# Patient Record
Sex: Male | Born: 1990 | Race: Asian | Hispanic: No | Marital: Single | Smoking: Former smoker
Health system: Southern US, Community
[De-identification: ages and names within clinical notes are randomized; demographics above are authoritative.]

## PROBLEM LIST (undated history)

## (undated) DIAGNOSIS — T7840XA Allergy, unspecified, initial encounter: Secondary | ICD-10-CM

## (undated) DIAGNOSIS — H9190 Unspecified hearing loss, unspecified ear: Secondary | ICD-10-CM

## (undated) DIAGNOSIS — K219 Gastro-esophageal reflux disease without esophagitis: Secondary | ICD-10-CM

## (undated) DIAGNOSIS — R21 Rash and other nonspecific skin eruption: Secondary | ICD-10-CM

## (undated) DIAGNOSIS — Z9621 Cochlear implant status: Secondary | ICD-10-CM

## (undated) HISTORY — DX: Cochlear implant status: Z96.21

## (undated) HISTORY — PX: ANTERIOR CRUCIATE LIGAMENT REPAIR: SHX115

## (undated) HISTORY — DX: Unspecified hearing loss, unspecified ear: H91.90

## (undated) HISTORY — DX: Rash and other nonspecific skin eruption: R21

## (undated) HISTORY — DX: Allergy, unspecified, initial encounter: T78.40XA

## (undated) HISTORY — PX: COCHLEAR IMPLANT: SUR684

## (undated) HISTORY — DX: Gastro-esophageal reflux disease without esophagitis: K21.9

---
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
## 2008-01-14 ENCOUNTER — Ambulatory Visit: Payer: Self-pay | Admitting: Family Medicine

## 2008-01-15 ENCOUNTER — Encounter: Admission: RE | Admit: 2008-01-15 | Discharge: 2008-01-15 | Payer: Self-pay | Admitting: Family Medicine

## 2009-06-22 ENCOUNTER — Ambulatory Visit: Payer: Self-pay | Admitting: Family Medicine

## 2010-02-22 ENCOUNTER — Ambulatory Visit: Payer: Self-pay | Admitting: Family Medicine

## 2010-05-17 ENCOUNTER — Institutional Professional Consult (permissible substitution): Payer: Medicaid Other | Admitting: Physician Assistant

## 2010-06-18 ENCOUNTER — Other Ambulatory Visit: Payer: Self-pay | Admitting: Family Medicine

## 2010-06-18 ENCOUNTER — Ambulatory Visit
Admission: RE | Admit: 2010-06-18 | Discharge: 2010-06-18 | Disposition: A | Discharge status: Home / Self Care | Payer: Medicaid Other | Source: Ambulatory Visit | Attending: Family Medicine | Admitting: Family Medicine

## 2010-06-18 ENCOUNTER — Ambulatory Visit (INDEPENDENT_AMBULATORY_CARE_PROVIDER_SITE_OTHER): Payer: Medicaid Other | Admitting: Family Medicine

## 2010-06-18 DIAGNOSIS — R52 Pain, unspecified: Secondary | ICD-10-CM

## 2010-06-18 DIAGNOSIS — S6000XA Contusion of unspecified finger without damage to nail, initial encounter: Secondary | ICD-10-CM

## 2010-06-27 ENCOUNTER — Ambulatory Visit (INDEPENDENT_AMBULATORY_CARE_PROVIDER_SITE_OTHER): Payer: Medicaid Other | Admitting: Family Medicine

## 2010-06-27 DIAGNOSIS — J0100 Acute maxillary sinusitis, unspecified: Secondary | ICD-10-CM

## 2010-08-09 ENCOUNTER — Ambulatory Visit (INDEPENDENT_AMBULATORY_CARE_PROVIDER_SITE_OTHER): Payer: Medicaid Other | Admitting: Family Medicine

## 2010-08-09 DIAGNOSIS — R599 Enlarged lymph nodes, unspecified: Secondary | ICD-10-CM

## 2010-08-15 ENCOUNTER — Encounter: Payer: Self-pay | Admitting: Family Medicine

## 2010-08-29 ENCOUNTER — Telehealth: Payer: Self-pay | Admitting: *Deleted

## 2010-08-29 ENCOUNTER — Ambulatory Visit: Payer: Medicaid Other | Admitting: Family Medicine

## 2010-08-29 NOTE — Telephone Encounter (Signed)
Left message on patients cell number to please r/s appt that was missed today ASAP. Also spoke with patient's sister(emergency contact) and she stated that she will give her brother the message to please reschedule, I did stress the importance of the recheck. I will also follow up again tomorrow.

## 2010-08-30 ENCOUNTER — Ambulatory Visit (INDEPENDENT_AMBULATORY_CARE_PROVIDER_SITE_OTHER): Payer: Medicaid Other | Admitting: Family Medicine

## 2010-08-30 ENCOUNTER — Encounter: Payer: Self-pay | Admitting: Family Medicine

## 2010-08-30 VITALS — BP 100/68 | HR 84 | Ht 64.0 in | Wt 169.0 lb

## 2010-08-30 DIAGNOSIS — R599 Enlarged lymph nodes, unspecified: Secondary | ICD-10-CM

## 2010-08-30 DIAGNOSIS — J309 Allergic rhinitis, unspecified: Secondary | ICD-10-CM

## 2010-08-30 DIAGNOSIS — R591 Generalized enlarged lymph nodes: Secondary | ICD-10-CM

## 2010-08-30 NOTE — Patient Instructions (Signed)
We are referring you to an ENT doctor for evaluation of the lymph node, which may include biopsy. You seem to have congestion and allergies, and might feel better by taking Claritin (OTC Loratidine) or Zyrtec on a regular basis

## 2010-08-30 NOTE — Progress Notes (Signed)
Addended byJoselyn Arrow on: 08/30/2010 05:08 PM   Modules accepted: Orders

## 2010-08-30 NOTE — Progress Notes (Signed)
  Subjective:    Patient ID: Antonio Li, male    DOB: 03/29/1991, 20 y.o.   MRN: [id number]  HPI Patient is here to follow up on enlarged lymph node.  He was last seen 4/26 and was rx'd Augmentin x 10 days.  He still has two pills, admits that he has only mainly been taking it once a day, and only sometimes taking it twice--thought he needed to take it with food, and often doesn't eat breakfast.  The enlarged lymph node persist.  Acne is about the same.  Denies fevers.  Trying to lose weight, trying to reach a goal of 165.  Active in playing soccer, and eating healthy foods such as fruits, and drinking a lot of water.  He has lost 10 pounds in the last 2 months (intentionally)   Review of Systems No fevers.  Some PND and sore throat.  Denies sinus pain.  Some cough, no SOB.  Denies sneezing, allergies.  No skin rash, nausea, vomiting or diarrhea    Objective:   Physical Exam BP 100/68  Pulse 84  Ht 5\' 4"  (1.626 m)  Wt 169 lb (76.658 kg)  BMI 29.01 kg/m2 Well developed, pleasant male, mildly congested, in no distress HEENT:  TM's normal; Nasal mucosa moderately edematous, clear mucus.  Sinuses nontender.  OP--tonsils mildly enlarged, but no erythema or exudates.  Mild cobblestoning posteriorly Neck:  3x2 cm firm lymph node L submandular region.  No overlying erythema, warmth, or fluctuance     Assessment & Plan:   1. Lymphadenopathy  Ambulatory referral to ENT   Unchanged after course of Augmentin (although noncompliant with taking med); firm node.  Refer to ENT for biopsy/eval  2. Allergic rhinitis, cause unspecified     Recommend re-start Claritin or other OTC antihistamine

## 2010-09-03 ENCOUNTER — Ambulatory Visit (INDEPENDENT_AMBULATORY_CARE_PROVIDER_SITE_OTHER): Payer: Medicaid Other | Admitting: Family Medicine

## 2010-09-03 ENCOUNTER — Encounter: Payer: Self-pay | Admitting: Family Medicine

## 2010-09-03 VITALS — BP 92/58 | HR 105 | Temp 98.6°F | Wt 173.0 lb

## 2010-09-03 DIAGNOSIS — J329 Chronic sinusitis, unspecified: Secondary | ICD-10-CM

## 2010-09-03 MED ORDER — AZITHROMYCIN 250 MG PO TABS: ORAL_TABLET | ORAL | Status: AC

## 2010-09-03 NOTE — Progress Notes (Signed)
  Subjective:    Patient ID: Antonio Li, male    DOB: 1990/09/01, 20 y.o.   MRN: [id number]  HPI Patient presents with complaint of runny nose, post-nasal drip, sore throat.  Denies sinus pain.  Feels hot sometimes, broke out into a sweat this morning.  Doesn't have thermometer.  Occasional cough.  Getting yellow mucus from the nose and phlegm.  Has been sick for a week, getting worse.  Took night-time Tylenol last night.  Had some vomiting this past weekend.  Denies diarrhea.  Denies sick contacts.  Review of Systems No diarrhea, skin rash, shortness of breath. +URI symptoms, cough, vomiting, + fatigue    Objective:   Physical Exam BP 92/58  Pulse 105  Temp(Src) 98.6 F (37 C) (Oral)  Wt 173 lb (78.472 kg)  SpO2 98% Well developed, pleasant male, in no distress HEENT: PERRL, EOMI; TM's not well visualized due to cerumen.  OP: Tonsils mildly enlarged, L>R, slight erythema.  Cobblestoning posteriorly   Sinuses nontender. Neck: unchanged--still has firm LN L submandibular area, no other LAD noted Hear: slightly tachycardic at about 96-100, no murmurs Lungs: CTA bilaterally, no wheezes Skin: no rash   Assessment & Plan:   1. Sinusitis  azithromycin (ZITHROMAX Z-PAK) 250 MG tablet  Supportive measures reviewed, including antihistamines, decongestants prn, and Mucinex.  Pt was given phone number for [facility] to schedule appt, as they had trouble reaching him to set up appt

## 2010-09-03 NOTE — Patient Instructions (Signed)
I recommend that you start taking Loratidine 10mg  (ie. Claritin), along with Sudafed, if needed, to help dry up your mucus and help with sinus pain.  I also recommend that you get Mucinex, as this will loosen your phlegm and help with your cough

## 2010-09-12 ENCOUNTER — Telehealth: Payer: Self-pay | Admitting: *Deleted

## 2010-09-12 NOTE — Telephone Encounter (Signed)
Left message with sister ZO:XWRUE auth needed for levofloxacin written by dr Onalee Hua shoemaker. Dr Lynelle Doctor tried to get this done but was unable, she called [facility] and they are aware of this request and are working on it. Just wanted to inform pat.

## 2010-09-14 ENCOUNTER — Other Ambulatory Visit: Payer: Self-pay | Admitting: Otolaryngology

## 2010-09-14 DIAGNOSIS — K118 Other diseases of salivary glands: Secondary | ICD-10-CM

## 2010-09-21 ENCOUNTER — Ambulatory Visit
Admission: RE | Admit: 2010-09-21 | Discharge: 2010-09-21 | Disposition: A | Discharge status: Home / Self Care | Payer: Medicaid Other | Source: Ambulatory Visit | Attending: Otolaryngology | Admitting: Otolaryngology

## 2010-09-21 DIAGNOSIS — K118 Other diseases of salivary glands: Secondary | ICD-10-CM

## 2010-09-21 MED ORDER — IOHEXOL 300 MG/ML  SOLN
75.0000 mL | Freq: Once | INTRAMUSCULAR | Status: AC | PRN
  Administered 2010-09-21: 75 mL via INTRAVENOUS

## 2010-11-12 ENCOUNTER — Encounter: Payer: Self-pay | Admitting: Family Medicine

## 2010-11-12 ENCOUNTER — Ambulatory Visit (INDEPENDENT_AMBULATORY_CARE_PROVIDER_SITE_OTHER): Payer: Medicaid Other | Admitting: Family Medicine

## 2010-11-12 VITALS — BP 92/60 | HR 84 | Ht 64.0 in | Wt 179.0 lb

## 2010-11-12 DIAGNOSIS — M25562 Pain in left knee: Secondary | ICD-10-CM

## 2010-11-12 DIAGNOSIS — M25569 Pain in unspecified knee: Secondary | ICD-10-CM

## 2010-11-12 MED ORDER — IBUPROFEN 800 MG PO TABS: 800.0000 mg | ORAL_TABLET | Freq: Three times a day (TID) | ORAL | Status: AC | PRN

## 2010-11-12 MED ORDER — HYDROCODONE-ACETAMINOPHEN 5-500 MG PO TABS: ORAL_TABLET | ORAL | Status: DC

## 2010-11-12 NOTE — Progress Notes (Signed)
Yesterday played soccer all day. During semi-finals, he approached the soccer ball same time as opponent, both kicking the ball with their right feet at the same time.  When this occurred, he felt a shock of pain at his left knee.  Fell down, couldn't get up.  R calf cramped, L knee swelled.  Unable to walk off field.  Used [employer] and ice, which helped with the swelling.  Didn't take any pain medications. Was unable to continue to play.  Today, continues to complain of pain.  Hurts to put weight on it, and to straighten and bend the left leg.  Denies radiation of pain.  Points to entire knee as painful (no one specific area)  Past Medical History  Diagnosis Date  . Deafness    Past Surgical History  Procedure Date  . Cochlear implant 2004   No current outpatient prescriptions on file prior to visit.   No Known Allergies  ROS:  Denies fevers.  Continues to have enlarged lymph node L jaw--he apparently missed an appointment for imaging procedure as ordered by ENT after an additional course of ABX.  PHYSICAL EXAM: BP 92/60  Pulse 84  Ht 5\' 4"  (1.626 m)  Wt 179 lb (81.194 kg)  BMI 30.73 kg/m2 Pleasant male, in moderate distress L knee--significant effusion, mild warmth.  Significantly limited range of motion--unable to completely extend, and very limited flexion.  This severely limited exam (unable to fully assess cruciate ligaments).  Mild tenderness over both medial and lateral collateral ligaments, but no pain with valgus and varus stressors 2+ distal pulse.  Intact skin  ASSESSMENT/PLAN 1. Left knee pain  ibuprofen (ADVIL,MOTRIN) 800 MG tablet, HYDROcodone-acetaminophen (VICODIN) 5-500 MG per tablet   Suspect internal derangement given effusion and acute onset of pain/swelling. Treat with knee immobilizer (Rx given to get at medical supply store), rest, ice elevation.  NSAID's, vicodin prn Follow-up with Dr. Susann Givens next week.  Consider arthrocentesis to help with pain control (by  draining effusion) if not responding to NSAIDs

## 2010-11-12 NOTE — Patient Instructions (Addendum)
Take the ibuprofen every day, three times daily; take with food.  Stop if it causes stomach pain, and call here. Use the hydrocodone only if needed for severe pain.   Use the knee immobilizer. Follow up next week for re-evaluation with Dr. Susann Givens  Rest, ice, elevate

## 2010-11-22 ENCOUNTER — Encounter: Payer: Self-pay | Admitting: Family Medicine

## 2010-11-22 ENCOUNTER — Ambulatory Visit (INDEPENDENT_AMBULATORY_CARE_PROVIDER_SITE_OTHER): Payer: Medicaid Other | Admitting: Family Medicine

## 2010-11-22 ENCOUNTER — Other Ambulatory Visit: Payer: Self-pay | Admitting: *Deleted

## 2010-11-22 VITALS — BP 112/70 | HR 68 | Wt 173.0 lb

## 2010-11-22 DIAGNOSIS — M25562 Pain in left knee: Secondary | ICD-10-CM

## 2010-11-22 DIAGNOSIS — M25569 Pain in unspecified knee: Secondary | ICD-10-CM

## 2010-11-22 NOTE — Progress Notes (Signed)
  Subjective:    Patient ID: Antonio Li, male    DOB: [birth year], 20 y.o.   MRN: [id number]  HPI He is here for recheck. The swelling has diminished. He still has difficulty with full range of motion of the knee.   Review of Systems     Objective:   Physical Exam Left knee exam does show a moderate effusion. Anterior drawer is positive. Murray's testing causes discomfort laterally. Medial lateral collateral ligaments are normal       Assessment & Plan:  Probable anterior cruciate ligament tear MRI.

## 2010-11-28 ENCOUNTER — Telehealth: Payer: Self-pay | Admitting: Family Medicine

## 2010-11-28 NOTE — Telephone Encounter (Signed)
He needs to get MRI--please see if authorization is needed. He needs MRI due to suspected acute ACL tear

## 2010-11-28 NOTE — Telephone Encounter (Signed)
Called [facility] @ [phone] option 3 with case #[id number] , they state that an MRI will not be covered until a 6-8 week trial of adequate conservative treatment has been completed, example: pain meds, steroid treatment or physical therapy. PT referral was sent to [facility] for patient. We can re-try to get MRI approved after completion of therapy for 6-8 weeks.

## 2010-11-29 ENCOUNTER — Other Ambulatory Visit: Payer: Medicaid Other

## 2011-01-29 ENCOUNTER — Telehealth: Payer: Self-pay | Admitting: Family Medicine

## 2011-01-30 NOTE — Telephone Encounter (Signed)
Dr. Lynelle Doctor I do not see where you recently spoke with patient, please advise.

## 2011-01-30 NOTE — Telephone Encounter (Signed)
Suzette Battiest spoke with them last week.  It has to do with the fact that he was referred for MRI of knee a LONG time ago.  It was denied, stating he needed to have PT first.  Apparently there was trouble reaching them and arranging for PT, and I'm not sure if it was even arranged yet (you may need to check with Suzette Battiest if no documentation about this in his chart--check paper chart)

## 2011-02-04 ENCOUNTER — Telehealth: Payer: Self-pay | Admitting: *Deleted

## 2011-02-04 NOTE — Telephone Encounter (Signed)
Called over to [facility] and spoke to [place]. She stated that they tried to call patient w/o any success. They will try again today. I also called his sisters number Dewayne Hatch) and left message for her with their phone number for her to call them and try to get [facility] scheduled for PT.

## 2011-02-05 ENCOUNTER — Ambulatory Visit: Payer: Medicaid Other | Attending: Orthopedic Surgery

## 2011-02-05 DIAGNOSIS — M25569 Pain in unspecified knee: Secondary | ICD-10-CM | POA: Insufficient documentation

## 2011-02-05 DIAGNOSIS — R262 Difficulty in walking, not elsewhere classified: Secondary | ICD-10-CM | POA: Insufficient documentation

## 2011-02-05 DIAGNOSIS — IMO0001 Reserved for inherently not codable concepts without codable children: Secondary | ICD-10-CM | POA: Insufficient documentation

## 2011-02-05 DIAGNOSIS — R5381 Other malaise: Secondary | ICD-10-CM | POA: Insufficient documentation

## 2011-02-18 ENCOUNTER — Ambulatory Visit: Payer: Medicaid Other | Admitting: Physical Therapy

## 2011-02-21 ENCOUNTER — Ambulatory Visit: Payer: Medicaid Other | Attending: Orthopedic Surgery

## 2011-02-21 DIAGNOSIS — M25569 Pain in unspecified knee: Secondary | ICD-10-CM | POA: Insufficient documentation

## 2011-02-21 DIAGNOSIS — R262 Difficulty in walking, not elsewhere classified: Secondary | ICD-10-CM | POA: Insufficient documentation

## 2011-02-21 DIAGNOSIS — IMO0001 Reserved for inherently not codable concepts without codable children: Secondary | ICD-10-CM | POA: Insufficient documentation

## 2011-02-21 DIAGNOSIS — R5381 Other malaise: Secondary | ICD-10-CM | POA: Insufficient documentation

## 2011-02-22 ENCOUNTER — Ambulatory Visit (INDEPENDENT_AMBULATORY_CARE_PROVIDER_SITE_OTHER): Payer: Medicaid Other | Admitting: Family Medicine

## 2011-02-22 ENCOUNTER — Encounter: Payer: Self-pay | Admitting: Family Medicine

## 2011-02-22 ENCOUNTER — Ambulatory Visit
Admission: RE | Admit: 2011-02-22 | Discharge: 2011-02-22 | Disposition: A | Discharge status: Home / Self Care | Payer: Medicaid Other | Source: Ambulatory Visit | Attending: Family Medicine | Admitting: Family Medicine

## 2011-02-22 VITALS — BP 118/78 | HR 95 | Wt 176.0 lb

## 2011-02-22 DIAGNOSIS — R1031 Right lower quadrant pain: Secondary | ICD-10-CM

## 2011-02-22 DIAGNOSIS — N200 Calculus of kidney: Secondary | ICD-10-CM

## 2011-02-22 LAB — CBC WITH DIFFERENTIAL/PLATELET
Lymphs Abs: 2.4 10*3/uL (ref 0.7–4.0)
MCH: 27.1 pg (ref 26.0–34.0)
MCHC: 32.3 g/dL (ref 30.0–36.0)
MCV: 84.1 fL (ref 78.0–100.0)
Monocytes Relative: 4 % (ref 3–12)
Neutro Abs: 12.2 10*3/uL — ABNORMAL HIGH (ref 1.7–7.7)
RDW: 12.4 % (ref 11.5–15.5)

## 2011-02-22 LAB — BASIC METABOLIC PANEL
Calcium: 9.3 mg/dL (ref 8.4–10.5)
Creat: 1 mg/dL (ref 0.50–1.35)
Glucose, Bld: 93 mg/dL (ref 70–99)
Sodium: 140 mEq/L (ref 135–145)

## 2011-02-22 MED ORDER — HYDROCODONE-ACETAMINOPHEN 7.5-750 MG PO TABS: 1.0000 | ORAL_TABLET | Freq: Four times a day (QID) | ORAL | Status: DC | PRN

## 2011-02-22 MED ORDER — IOHEXOL 300 MG/ML  SOLN
100.0000 mL | Freq: Once | INTRAMUSCULAR | Status: AC | PRN
  Administered 2011-02-22: 100 mL via INTRAVENOUS

## 2011-02-22 NOTE — Progress Notes (Signed)
  Subjective:    Patient ID: Antonio Li, male    DOB: Jul 01, 1990, 20 y.o.   MRN: [id number]  HPI He has a 3 day history of right lower cautery and pain with nausea but no vomiting, fever, diarrhea.   Review of Systems     Objective:   Physical Exam Alert and uncomfortable appearing. Abdominal exam shows decreased bowel sounds with no masses. He does have right lower "or tenderness with rebound and referred pain. Genital exam normal.       Assessment & Plan:  Right lower quadrant pain. CT scan ordered. The CT scan showed a kidney stone. He will drink plenty of fluids, take pain medication and I will see him on Monday.

## 2011-02-25 ENCOUNTER — Telehealth: Payer: Self-pay | Admitting: Family Medicine

## 2011-02-25 ENCOUNTER — Ambulatory Visit: Payer: Medicaid Other | Admitting: Family Medicine

## 2011-02-26 ENCOUNTER — Telehealth: Payer: Self-pay | Admitting: Family Medicine

## 2011-02-26 NOTE — Telephone Encounter (Signed)
LM

## 2011-02-26 NOTE — Telephone Encounter (Signed)
DONE

## 2011-02-28 ENCOUNTER — Ambulatory Visit: Payer: Medicaid Other

## 2011-03-05 ENCOUNTER — Ambulatory Visit: Payer: Medicaid Other

## 2011-03-05 ENCOUNTER — Ambulatory Visit (INDEPENDENT_AMBULATORY_CARE_PROVIDER_SITE_OTHER): Payer: Medicaid Other | Admitting: Family Medicine

## 2011-03-05 ENCOUNTER — Encounter: Payer: Self-pay | Admitting: Family Medicine

## 2011-03-05 VITALS — BP 110/64 | HR 92 | Wt 176.0 lb

## 2011-03-05 DIAGNOSIS — M25562 Pain in left knee: Secondary | ICD-10-CM

## 2011-03-05 DIAGNOSIS — M25569 Pain in unspecified knee: Secondary | ICD-10-CM

## 2011-03-05 MED ORDER — AMOXICILLIN 875 MG PO TABS: 875.0000 mg | ORAL_TABLET | Freq: Two times a day (BID) | ORAL | Status: AC

## 2011-03-05 NOTE — Patient Instructions (Addendum)
I will call you with the results of the CT scan

## 2011-03-05 NOTE — Progress Notes (Signed)
  Subjective:    Patient ID: Antonio Li, male    DOB: [birth year], 22 y.o.   MRN: [id number]  HPI He is here for a recheck. He has been through knee rehabilitation. The evaluation from physical therapy indicates that he is really made no progress and in fact he is now noting popping sensation especially goes up and down steps. He still does have some instability. He also complains of a two-week history of chest congestion and coughing. No fever, chills or sore throat.   Review of Systems     Objective:   Physical Exam Exam of the left knee does show minimal effusion. Anterior drawer is positive. McMurray's testing did not elicit a pop however there was discomfort. Medial and lateral collateral ligaments were intact. alert and in no distress. Tympanic membranes and canals are normal. Throat is clear. Tonsils are normal. Neck is supple without adenopathy or thyromegaly. Cardiac exam shows a regular sinus rhythm without murmurs or gallops. Lungs are clear to auscultation.       Assessment & Plan:  Internal derangement of left knee. Bronchitis I will call in Amoxil. CT will be ordered to do to him having a cochlear implant.

## 2011-03-06 NOTE — Progress Notes (Signed)
Addended by: Barbette Or A on: 03/06/2011 03:13 PM   Modules accepted: Orders

## 2011-03-08 ENCOUNTER — Ambulatory Visit
Admission: RE | Admit: 2011-03-08 | Discharge: 2011-03-08 | Disposition: A | Discharge status: Home / Self Care | Payer: Medicaid Other | Source: Ambulatory Visit | Attending: Family Medicine | Admitting: Family Medicine

## 2011-03-08 ENCOUNTER — Other Ambulatory Visit: Payer: Medicaid Other

## 2011-03-08 ENCOUNTER — Other Ambulatory Visit: Payer: Self-pay | Admitting: Family Medicine

## 2011-03-08 DIAGNOSIS — M25569 Pain in unspecified knee: Secondary | ICD-10-CM

## 2011-03-08 DIAGNOSIS — M25562 Pain in left knee: Secondary | ICD-10-CM

## 2011-04-22 ENCOUNTER — Telehealth: Payer: Self-pay | Admitting: Family Medicine

## 2011-04-22 NOTE — Telephone Encounter (Signed)
He needs to get this from his surgeon

## 2011-04-23 NOTE — Telephone Encounter (Signed)
Left message pt needs to get paper filled out by surgen

## 2011-04-24 ENCOUNTER — Ambulatory Visit: Payer: Medicaid Other | Attending: Orthopedic Surgery | Admitting: Rehabilitative and Restorative Service Providers"

## 2011-04-24 DIAGNOSIS — IMO0001 Reserved for inherently not codable concepts without codable children: Secondary | ICD-10-CM | POA: Insufficient documentation

## 2011-04-24 DIAGNOSIS — R5381 Other malaise: Secondary | ICD-10-CM | POA: Insufficient documentation

## 2011-04-24 DIAGNOSIS — M25569 Pain in unspecified knee: Secondary | ICD-10-CM | POA: Insufficient documentation

## 2011-04-24 DIAGNOSIS — R262 Difficulty in walking, not elsewhere classified: Secondary | ICD-10-CM | POA: Insufficient documentation

## 2011-04-30 ENCOUNTER — Ambulatory Visit: Payer: Medicaid Other

## 2011-05-07 ENCOUNTER — Ambulatory Visit: Payer: Medicaid Other | Admitting: Rehabilitation

## 2011-05-08 ENCOUNTER — Ambulatory Visit: Payer: Medicaid Other | Admitting: Physical Therapy

## 2011-05-09 ENCOUNTER — Encounter: Payer: Medicaid Other | Admitting: Rehabilitative and Restorative Service Providers"

## 2011-05-14 ENCOUNTER — Encounter: Payer: Medicaid Other | Admitting: Rehabilitative and Restorative Service Providers"

## 2011-05-16 ENCOUNTER — Encounter: Payer: Medicaid Other | Admitting: Rehabilitative and Restorative Service Providers"

## 2011-05-21 ENCOUNTER — Ambulatory Visit: Payer: Medicaid Other | Attending: Orthopedic Surgery | Admitting: Rehabilitative and Restorative Service Providers"

## 2011-05-21 DIAGNOSIS — IMO0001 Reserved for inherently not codable concepts without codable children: Secondary | ICD-10-CM | POA: Insufficient documentation

## 2011-05-21 DIAGNOSIS — M25569 Pain in unspecified knee: Secondary | ICD-10-CM | POA: Insufficient documentation

## 2011-05-28 ENCOUNTER — Ambulatory Visit: Payer: Medicaid Other | Admitting: Rehabilitative and Restorative Service Providers"

## 2011-11-07 ENCOUNTER — Ambulatory Visit (INDEPENDENT_AMBULATORY_CARE_PROVIDER_SITE_OTHER): Payer: Medicaid Other | Admitting: Family Medicine

## 2011-11-07 ENCOUNTER — Encounter: Payer: Self-pay | Admitting: Family Medicine

## 2011-11-07 VITALS — BP 100/76 | HR 68 | Ht 64.0 in | Wt 176.0 lb

## 2011-11-07 DIAGNOSIS — R0781 Pleurodynia: Secondary | ICD-10-CM

## 2011-11-07 DIAGNOSIS — R079 Chest pain, unspecified: Secondary | ICD-10-CM

## 2011-11-07 MED ORDER — NAPROXEN 500 MG PO TABS: 500.0000 mg | ORAL_TABLET | Freq: Two times a day (BID) | ORAL | Status: DC

## 2011-11-07 NOTE — Patient Instructions (Addendum)
If the prescription naproxen is more expensive than over-the-counter naproxen, you can get the OTC instead--take 2 of the 220mg  tablets twice daily with food (instead of the prescription 500mg  twice daily).  Do NOT take advil, motrin, ibuprofen, aleve, naproxen or aspirin along with this medication.  You CAN use tylenol also for pain.   Rib Contusion A rib contusion (bruise) can occur by a blow to the chest or by a fall against a hard object. Usually these will be much better in a couple weeks. If X-rays were taken today and there are no broken bones (fractures), the diagnosis of bruising is made. However, broken ribs may not show up for several days, or may be discovered later on a routine X-ray when signs of healing show up. If this happens to you, it does not mean that something was missed on the X-ray, but simply that it did not show up on the first X-rays. Earlier diagnosis will not usually change the treatment. HOME CARE INSTRUCTIONS   Avoid strenuous activity. Be careful during activities and avoid bumping the injured ribs. Activities that pull on the injured ribs and cause pain should be avoided, if possible.   For the first day or two, an ice pack used every 20 minutes while awake may be helpful. Put ice in a plastic bag and put a towel between the bag and the skin.   Eat a normal, well-balanced diet. Drink plenty of fluids to avoid constipation.   Take deep breaths several times a day to keep lungs free of infection. Try to cough several times a day. Splint the injured area with a pillow while coughing to ease pain. Coughing can help prevent pneumonia.   Wear a rib belt or binder only if told to do so by your caregiver. If you are wearing a rib belt or binder, you must do the breathing exercises as directed by your caregiver. If not used properly, rib belts or binders restrict breathing which can lead to pneumonia.   Only take over-the-counter or prescription medicines for pain,  discomfort, or fever as directed by your caregiver.  SEEK MEDICAL CARE IF:   You or your child has an oral temperature above 102 F (38.9 C).   Your baby is older than 3 months with a rectal temperature of 100.5 F (38.1 C) or higher for more than 1 day.   You develop a cough, with thick or bloody sputum.  SEEK IMMEDIATE MEDICAL CARE IF:   You have difficulty breathing.   You feel sick to your stomach (nausea), have vomiting or belly (abdominal) pain.   You have worsening pain, not controlled with medications, or there is a change in the location of the pain.   You develop sweating or radiation of the pain into the arms, jaw or shoulders, or become light headed or faint.   You or your child has an oral temperature above 102 F (38.9 C), not controlled by medicine.   Your or your baby is older than 3 months with a rectal temperature of 102 F (38.9 C) or higher.   Your baby is 27 months old or younger with a rectal temperature of 100.4 F (38 C) or higher.  MAKE SURE YOU:   Understand these instructions.   Will watch your condition.   Will get help right away if you are not doing well or get worse.  Document Released: 12/25/2000 Document Revised: 03/21/2011 Document Reviewed: 11/18/2007 [facility] Patient Information 2012 [place], [place].

## 2011-11-07 NOTE — Progress Notes (Signed)
Chief Complaint  Patient presents with  . Rib Injury    he was in [place] last Friday, he was playing soccer and took a shoulder to his right rib cage. Sore and painful and hurts when he tries to sit up straight and hurts when he takes a deep breath.   HPI:  Injured 6 days ago during soccer game--hit in R ribs with someone's shoulder.  Pain is ongoing.  Hurts to sit up straight, kept him from sleeping last night due to pain.  Hurts to cough, and with deep breaths, but no shortness of breath  Chill last night.  Denies fever. Was very short-lived (chills).  Hurts to stand up straight, with position changes, coughing, deep breaths.  Past Medical History  Diagnosis Date  . Deafness    Past Surgical History  Procedure Date  . Cochlear implant 2004   No current outpatient prescriptions on file prior to visit.   No Known Allergies  ROS:  Denies known fevers, GI complaints, cough, shortness of breath, chest pain (just at ribs), bruising, skin rashes, other joint pains or other concerns.  PHYSICAL EXAM: BP 100/76  Pulse 68  Ht 5\' 4"  (1.626 m)  Wt 176 lb (79.833 kg)  BMI 30.21 kg/m2 Pleasant male, in moderate discomfort with certain movements.  Sitting comfortably, slightly slouched forward  Tender R lower ribs, from lower part of sternum, down to floating ribs.  No mass or angulation noted.  No bruising.  Remainder of chest wall and back/spine are nontender. Heart: regular rate and rhythm Lungs: clear, with full breath sounds throughout Abdomen: soft, nontender, no mass Skin: no rashes or bruising  ASSESSMENT/PLAN: 1. Rib pain on right side  naproxen (NAPROSYN) 500 MG tablet   Can't rule out rib fracture, but non-angulated (if present), and no difference in treatment.  All questions were answered, and supportive measures were reviewed.  Risks/side effects of naprosyn reviewed (can use OTC if less expensive). Discussed rib belts--only to use short-term, and ensure doing deep breathing  exercises

## 2012-09-16 ENCOUNTER — Encounter: Payer: Self-pay | Admitting: Medical

## 2012-09-16 ENCOUNTER — Ambulatory Visit (INDEPENDENT_AMBULATORY_CARE_PROVIDER_SITE_OTHER): Payer: Medicaid Other | Admitting: Medical

## 2012-09-16 VITALS — BP 100/70 | HR 64 | Temp 98.0°F | Resp 16 | Wt 174.0 lb

## 2012-09-16 DIAGNOSIS — M25561 Pain in right knee: Secondary | ICD-10-CM

## 2012-09-16 DIAGNOSIS — S139XXA Sprain of joints and ligaments of unspecified parts of neck, initial encounter: Secondary | ICD-10-CM

## 2012-09-16 DIAGNOSIS — M25569 Pain in unspecified knee: Secondary | ICD-10-CM

## 2012-09-16 NOTE — Patient Instructions (Signed)
Hyperextension injury and bruised meniscus   Rest for another 1-2 weeks.    No soccer, basketball or twisting type exercise for now  You can walk or use stationary bike for now  Ice the knee daily  Elevate the leg daily in the evening  Use knee sleeve for support and comfort x 1-2 weeks  If not much better in 2 weeks, recheck.

## 2012-09-16 NOTE — Progress Notes (Signed)
Subjective: Here for c/o right knee pain x 51mo.   A month ago his friend pushed him from behind playing forward, and he fell forward, but hyperextended the leg as he fell.   Since then been having pain, worse with kicking soccer ball or extended the leg.  No swelling, no hip pain, no ankle pain, no redness.   When extending the leg gets numb feeling in the leg.  Using ibuprofen some.  He rested and iced the knee for a week, but has been back playing soccer daily.  Past Medical History  Diagnosis Date  . Deafness    ROS as in subjective  Objective: Gen: wd, wn, nad Skin: no erythema or ecchymosis MSK: right knee nontender, no laxity, no swelling, mild pain with leg extension, mild pain with flexion with foot inverted, otherwise nontender, no pain with hip or ankle ROM.  right knee with surgical scar.  Rest of LE exam unremarkable Pulses normal Ext: no edema   Assessment: Encounter Diagnoses  Name Primary?  . Knee pain, acute, right Yes  . Hyperextension injury, initial encounter    Plan: Patient Instructions  Hyperextension injury and bruised meniscus   Rest for another 1-2 weeks.    No soccer, basketball or twisting type exercise for now  You can walk or use stationary bike for now  Ice the knee daily  Elevate the leg daily in the evening  Use knee sleeve for support and comfort x 1-2 weeks  If not much better in 2 weeks, recheck.

## 2013-04-26 ENCOUNTER — Ambulatory Visit (INDEPENDENT_AMBULATORY_CARE_PROVIDER_SITE_OTHER): Payer: Medicaid Other | Admitting: Family Medicine

## 2013-04-26 ENCOUNTER — Encounter: Payer: Self-pay | Admitting: Family Medicine

## 2013-04-26 VITALS — BP 106/78 | HR 83 | Wt 170.0 lb

## 2013-04-26 DIAGNOSIS — S93401A Sprain of unspecified ligament of right ankle, initial encounter: Secondary | ICD-10-CM

## 2013-04-26 DIAGNOSIS — S93409A Sprain of unspecified ligament of unspecified ankle, initial encounter: Secondary | ICD-10-CM

## 2013-04-26 NOTE — Patient Instructions (Addendum)
First you have to walk without pain then start jogging at half speed then full speed and then you can cut back and forth and then he can run up and down steps Take either   Advil or Aleve for pain reliefAnkle Sprain An ankle sprain is an injury to the strong, fibrous tissues (ligaments) that hold the bones of your ankle joint together.  CAUSES An ankle sprain is usually caused by a fall or by twisting your ankle. Ankle sprains most commonly occur when you step on the outer edge of your foot, and your ankle turns inward. People who participate in sports are more prone to these types of injuries.  SYMPTOMS   Pain in your ankle. The pain may be present at rest or only when you are trying to stand or walk.  Swelling.  Bruising. Bruising may develop immediately or within 1 to 2 days after your injury.  Difficulty standing or walking, particularly when turning corners or changing directions. DIAGNOSIS  Your caregiver will ask you details about your injury and perform a physical exam of your ankle to determine if you have an ankle sprain. During the physical exam, your caregiver will press on and apply pressure to specific areas of your foot and ankle. Your caregiver will try to move your ankle in certain ways. An X-ray exam may be done to be sure a bone was not broken or a ligament did not separate from one of the bones in your ankle (avulsion fracture).  TREATMENT  Certain types of braces can help stabilize your ankle. Your caregiver can make a recommendation for this. Your caregiver may recommend the use of medicine for pain. If your sprain is severe, your caregiver may refer you to a surgeon who helps to restore function to parts of your skeletal system (orthopedist) or a physical therapist. HOME CARE INSTRUCTIONS   Apply ice to your injury for 1 2 days or as directed by your caregiver. Applying ice helps to reduce inflammation and pain.  Put ice in a plastic bag.  Place a towel between your  skin and the bag.  Leave the ice on for 15-20 minutes at a time, every 2 hours while you are awake.  Only take over-the-counter or prescription medicines for pain, discomfort, or fever as directed by your caregiver.  Elevate your injured ankle above the level of your heart as much as possible for 2 3 days.  If your caregiver recommends crutches, use them as instructed. Gradually put weight on the affected ankle. Continue to use crutches or a cane until you can walk without feeling pain in your ankle.  If you have a plaster splint, wear the splint as directed by your caregiver. Do not rest it on anything harder than a pillow for the first 24 hours. Do not put weight on it. Do not get it wet. You may take it off to take a shower or bath.  You may have been given an elastic bandage to wear around your ankle to provide support. If the elastic bandage is too tight (you have numbness or tingling in your foot or your foot becomes cold and blue), adjust the bandage to make it comfortable.  If you have an air splint, you may blow more air into it or let air out to make it more comfortable. You may take your splint off at night and before taking a shower or bath. Wiggle your toes in the splint several times per day to decrease swelling. SEEK MEDICAL  CARE IF:   You have rapidly increasing bruising or swelling.  Your toes feel extremely cold or you lose feeling in your foot.  Your pain is not relieved with medicine. SEEK IMMEDIATE MEDICAL CARE IF:  Your toes are numb or blue.  You have severe pain that is increasing. MAKE SURE YOU:   Understand these instructions.  Will watch your condition.  Will get help right away if you are not doing well or get worse. Document Released: 04/01/2005 Document Revised: 12/25/2011 Document Reviewed: 04/13/2011 [facility] Patient Information 2014 [place], [place].

## 2013-04-26 NOTE — Progress Notes (Signed)
   Subjective:    Patient ID: Antonio Li, male    DOB: 11/05/1990, 23 y.o.   MRN: [id number]  HPI He inverted his right ankle last Monday while playing soccer. He is able to walk on it but cannot run. He is having no difficulty.   Review of Systems     Objective:   Physical Exam Exam of the right ankle shows full motion with normal strength. Minimal anterior drawer laxity is noted. Slight tenderness to palpation noted just superior to the ATF. Ankle compression caused no difficulty. Normal pulses.       Assessment & Plan:  Right ankle sprain, initial encounter  recommending conservative care. Explained that he needs to slowly increase his physical activity from walking to jogging to running to cutting. Information was given in the AVS.

## 2013-10-07 ENCOUNTER — Ambulatory Visit (INDEPENDENT_AMBULATORY_CARE_PROVIDER_SITE_OTHER): Payer: Medicaid Other | Admitting: Medical

## 2013-10-07 ENCOUNTER — Encounter: Payer: Self-pay | Admitting: Medical

## 2013-10-07 VITALS — BP 100/70 | HR 82 | Temp 97.7°F | Resp 16 | Wt 167.0 lb

## 2013-10-07 DIAGNOSIS — H6692 Otitis media, unspecified, left ear: Secondary | ICD-10-CM

## 2013-10-07 DIAGNOSIS — H6120 Impacted cerumen, unspecified ear: Secondary | ICD-10-CM

## 2013-10-07 DIAGNOSIS — H6123 Impacted cerumen, bilateral: Secondary | ICD-10-CM

## 2013-10-07 DIAGNOSIS — H6690 Otitis media, unspecified, unspecified ear: Secondary | ICD-10-CM

## 2013-10-07 MED ORDER — AMOXICILLIN 875 MG PO TABS: 875.0000 mg | ORAL_TABLET | Freq: Two times a day (BID) | ORAL | Status: DC

## 2013-10-07 NOTE — Progress Notes (Signed)
Subjective:   Antonio Li is a 23 y.o. male who presents with ear pain and possible ear infection. Symptoms include: left ear pain. Onset of symptoms was 1 week ago, and have been unchanged since that time. Associated symptoms include: none.  Patient denies: chills, coryza, fever , headache, sinus pressure, sneezing and sore throat. He is drinking plenty of fluids.  Treatment to date: none.  Denies sick contacts.  No other aggravating or relieving factors.  No other c/o.  ROS as in subjective  Objective:  Filed Vitals:   10/07/13 1454  BP: 100/70  Pulse: 82  Temp: 97.7 F (36.5 C)  Resp: 16    General appearance: Alert, WD/WN, no distress, mildly ill appearing                             Skin: warm, no rash                           Head: no sinus tenderness                            Eyes: conjunctiva normal, corneas clear, PERRLA                            Ears: impacted cerumen bilat, hearing aid on the right.                           Nose: septum midline, turbinates swollen, with erythema and clear discharge             Mouth/throat: MMM, tongue normal, mild pharyngeal erythema                           Neck: supple, no adenopathy, no thyromegaly, nontender                          lungs: CTA bilaterally, no wheezes, rales, or rhonchi     Assessment: Encounter Diagnoses  Name Primary?  . Acute left otitis media, recurrence not specified, unspecified otitis media type Yes  . Impacted cerumen of both ears       Plan: Discussed findings.  Discussed risk/benefits of procedure and patient agrees to procedure. Successfully used warm water lavage to remove impacted cerumen from bilat ear canal. Patient tolerated procedure well. Advised they avoid using any cotton swabs or other devices to clean the ear canals.  Use basic hygiene as discussed.  Follow up prn.   After disimpacting cerumen, left ear appeared to have OM.    Prescription for Amoxicillin given as below.  Discussed  diagnosis and treatment of otitis media.  Suggested symptomatic OTC remedies.  Tylenol or Ibuprofen OTC for fever and malaise.  Call/return in 2-3 days if symptoms aren't resolving.

## 2013-10-07 NOTE — Patient Instructions (Signed)
  Thank you for giving me the opportunity to serve you today.    Your diagnosis today includes: Encounter Diagnoses  Name Primary?  . Impacted cerumen of both ears Yes  . Acute left otitis media, recurrence not specified, unspecified otitis media type      Specific recommendations today include:  Begin Amoxicillin antibiotic twice daily for 10 days  increased water intake  If not completely back to normal in 7-10 days, then recheck

## 2014-10-04 ENCOUNTER — Ambulatory Visit
Admission: RE | Admit: 2014-10-04 | Discharge: 2014-10-04 | Disposition: A | Discharge status: Home / Self Care | Payer: Medicaid Other | Source: Ambulatory Visit | Attending: Family Medicine | Admitting: Family Medicine

## 2014-10-04 ENCOUNTER — Ambulatory Visit (INDEPENDENT_AMBULATORY_CARE_PROVIDER_SITE_OTHER): Payer: Medicaid Other | Admitting: Family Medicine

## 2014-10-04 VITALS — BP 100/60 | HR 68 | Wt 170.0 lb

## 2014-10-04 DIAGNOSIS — S93402A Sprain of unspecified ligament of left ankle, initial encounter: Secondary | ICD-10-CM

## 2014-10-04 NOTE — Progress Notes (Signed)
   Subjective:    Patient ID: Antonio Li, male    DOB: 1990-11-13, 24 y.o.   MRN: [id number]  HPI He twisted his left ankle playing soccer on Sunday. He was unable to play after that. He is noted swelling and discoloration on the medial and lateral aspect.   Review of Systems     Objective:   Physical Exam Swelling with discoloration on the medial lateral aspect of the foot. Tender to palpation over the lateral malleolus. Slightly positive anterior drawer. Good motion of the ankle. No tenderness over the head of the fifth metatarsal.       Assessment & Plan:  Left ankle sprain, initial encounter - Plan: DG Ankle Complete Left The x-ray was negative. I will have him return here for consultation concerning proper rehabilitation.

## 2014-10-12 ENCOUNTER — Ambulatory Visit (INDEPENDENT_AMBULATORY_CARE_PROVIDER_SITE_OTHER): Payer: Medicaid Other | Admitting: Family Medicine

## 2014-10-12 DIAGNOSIS — S93402D Sprain of unspecified ligament of left ankle, subsequent encounter: Secondary | ICD-10-CM

## 2014-10-12 NOTE — Patient Instructions (Signed)
Your range of motion exercises for your ankle 3 times per day. First you walk without pain then jog without pain then run without pain and you canen cut. Once you can do all that then to return to full activity

## 2014-10-12 NOTE — Progress Notes (Signed)
   Subjective:    Patient ID: Antonio Li, male    DOB: 07/04/1990, 24 y.o.   MRN: [id number]  HPI He is here for a recheck. He is still having some slight ankle discomfort.   Review of Systems     Objective:   Physical Exam Exam of the left ankle shows minimal tenderness medially as well as lateral. No tenderness over ATF. No laxity noted. Normal strength and motion.       Assessment & Plan:  Left ankle sprain, subsequent encounter Demonstrated rehabilitation exercises for him. Recommend returning to physical activity based on his pain. Instructions given in AVS

## 2015-05-03 ENCOUNTER — Encounter: Payer: Self-pay | Admitting: Family Medicine

## 2015-05-03 ENCOUNTER — Ambulatory Visit (INDEPENDENT_AMBULATORY_CARE_PROVIDER_SITE_OTHER): Payer: Medicaid Other | Admitting: Family Medicine

## 2015-05-03 VITALS — BP 96/58 | HR 60 | Ht 64.0 in | Wt 177.8 lb

## 2015-05-03 DIAGNOSIS — M545 Low back pain, unspecified: Secondary | ICD-10-CM

## 2015-05-03 DIAGNOSIS — M62830 Muscle spasm of back: Secondary | ICD-10-CM

## 2015-05-03 MED ORDER — METHOCARBAMOL 500 MG PO TABS
500.0000 mg | ORAL_TABLET | Freq: Four times a day (QID) | ORAL | Status: DC | PRN
Start: 2015-05-03 — End: 2015-09-04

## 2015-05-03 MED ORDER — NAPROXEN 500 MG PO TABS: 500.0000 mg | ORAL_TABLET | Freq: Two times a day (BID) | ORAL | Status: DC

## 2015-05-03 NOTE — Patient Instructions (Signed)
Use heating pad or Thermacare, or apply heat to the area 15-20 minutes at least 3 times daily. After using heat, do some stretches of the back (leaning forward).  Gentle massage of the muscles after heat is also a good idea. Take the prescription anti-inflammatory twice daily with food until your back pain is better (up to 10 days).  Take this regularly until your pain has resolved--you don't need to finish all the pills if your pain gets completely better. Use the muscle relaxant only when in severe pain, to help with muscle spasm. This medication shouldn't make you too sleepy, but use caution when driving just in case.  Return if you develop worsening pain, fever, problems with your urine or bowels, numbness, tingling, weakness, rash or other concerns.  Take your prescribed anti-inflammatory medication with food; discontinue or cut back the dose if you develop stomach pain/discomfort/side effects.  Do not take other over-the-counter pain medications such as ibuprofen, advil, motrin, aleve, naproxen at the same time.  Do not use longer than recommended.  It is okay to use acetaminophen (tylenol) along with this medication.  Back Pain, Adult Back pain is very common in adults.The cause of back pain is rarely dangerous and the pain often gets better over time.The cause of your back pain may not be known. Some common causes of back pain include:  Strain of the muscles or ligaments supporting the spine.  Wear and tear (degeneration) of the spinal disks.  Arthritis.  Direct injury to the back. For many people, back pain may return. Since back pain is rarely dangerous, most people can learn to manage this condition on their own. HOME CARE INSTRUCTIONS Watch your back pain for any changes. The following actions may help to lessen any discomfort you are feeling:  Remain active. It is stressful on your back to sit or stand in one place for long periods of time. Do not sit, drive, or stand in one  place for more than 30 minutes at a time. Take short walks on even surfaces as soon as you are able.Try to increase the length of time you walk each day.  Exercise regularly as directed by your health care provider. Exercise helps your back heal faster. It also helps avoid future injury by keeping your muscles strong and flexible.  Do not stay in bed.Resting more than 1-2 days can delay your recovery.  Pay attention to your body when you bend and lift. The most comfortable positions are those that put less stress on your recovering back. Always use proper lifting techniques, including:  Bending your knees.  Keeping the load close to your body.  Avoiding twisting.  Find a comfortable position to sleep. Use a firm mattress and lie on your side with your knees slightly bent. If you lie on your back, put a pillow under your knees.  Avoid feeling anxious or stressed.Stress increases muscle tension and can worsen back pain.It is important to recognize when you are anxious or stressed and learn ways to manage it, such as with exercise.  Take medicines only as directed by your health care provider. Over-the-counter medicines to reduce pain and inflammation are often the most helpful.Your health care provider may prescribe muscle relaxant drugs.These medicines help dull your pain so you can more quickly return to your normal activities and healthy exercise.  Apply ice to the injured area:  Put ice in a plastic bag.  Place a towel between your skin and the bag.  Leave the ice on  for 20 minutes, 2-3 times a day for the first 2-3 days. After that, ice and heat may be alternated to reduce pain and spasms.  Maintain a healthy weight. Excess weight puts extra stress on your back and makes it difficult to maintain good posture. SEEK MEDICAL CARE IF:  You have pain that is not relieved with rest or medicine.  You have increasing pain going down into the legs or buttocks.  You have pain that  does not improve in one week.  You have night pain.  You lose weight.  You have a fever or chills. SEEK IMMEDIATE MEDICAL CARE IF:   You develop new bowel or bladder control problems.  You have unusual weakness or numbness in your arms or legs.  You develop nausea or vomiting.  You develop abdominal pain.  You feel faint.   This information is not intended to replace advice given to you by your health care provider. Make sure you discuss any questions you have with your health care provider.   Document Released: 04/01/2005 Document Revised: 04/22/2014 Document Reviewed: 08/03/2013 Elsevier Interactive Patient Education [employer]

## 2015-05-03 NOTE — Progress Notes (Signed)
Chief Complaint  Patient presents with  . Back Pain    2 days ago, Monday evening he was playing soccer and was running a lot and thinks he may have pulled something in his lower back. Hurt so much Monday night that he could not even shower, went and layed down, pain was so bad he could not sleep. (could not give UA, just went). Has happened before but a long time ago.     He started with back pain 2 evenings ago, after playing soccer.  He first noticed the pain when he was getting in the car after playing. It got worse that evening.  Pain was in the midline of the lower back.  Denies any radiation of the pain.  Hurts more when he moves his legs, especially when he tries to lift them. No numbness, tingling, weakness in his legs. He didn't treat it at all on Monday.  Yesterday he took  of advil in the afternoon, without any benefit.  It has gotten a little better today.  PMH, PSH, SH reviewed.  No current outpatient prescriptions on file prior to visit.   No current facility-administered medications on file prior to visit.   No Known Allergies  ROS:  No urinary symptoms, or bowel changes. No fever, chills. No bleeding, bruising, rash, numbness, tingling, weakness, URI symptoms or other concerns.   PHYSICAL EXAM: BP 96/58 mmHg  Pulse 60  Ht  (1.626 m)  Wt 177 lb 12.8 oz (80.65 kg)  BMI 30.50 kg/m2  Well appearing male in no distress. Has mild discomfort with position changes--leaning forward or lifting his legs Back: no CVA tenderness. No spinal tenderness or SI joint tenderness He has no spasm palpable, but has pain in the paraspinous muscles bilaterally in lumbar region when flexing forward or flexing hips to raise his legs. DTR's 1+ and symmetric. Normal strength, sensation, gait Pain only in the back with SLR, no radiation into the legs.  ASSESSMENT/PLAN:  Midline low back pain without sciatica - Plan: naproxen (NAPROSYN) 500 MG tablet  Muscle spasm of back - Plan:  methocarbamol (ROBAXIN) 500 MG tablet   Low back pain--muscular. No evidence of radiculopathy.   Use heating pad or Thermacare, or apply heat to the area 15-20 minutes at least 3 times daily. After using heat, do some stretches of the back (leaning forward).  Gentle massage of the muscles after heat is also a good idea. Take the prescription anti-inflammatory twice daily with food until your back pain is better (up to 10 days).  Take this regularly until your pain has resolved--you don't need to finish all the pills if your pain gets completely better. Use the muscle relaxant only when in severe pain, to help with muscle spasm. This medication shouldn't make you too sleepy, but use caution when driving just in case.

## 2015-06-12 ENCOUNTER — Ambulatory Visit (INDEPENDENT_AMBULATORY_CARE_PROVIDER_SITE_OTHER): Payer: Medicaid Other | Admitting: Family Medicine

## 2015-06-12 ENCOUNTER — Encounter: Payer: Self-pay | Admitting: Family Medicine

## 2015-06-12 VITALS — BP 96/70 | HR 71 | Wt 175.5 lb

## 2015-06-12 DIAGNOSIS — J301 Allergic rhinitis due to pollen: Secondary | ICD-10-CM | POA: Insufficient documentation

## 2015-06-12 NOTE — Progress Notes (Signed)
   Subjective:    Patient ID: Antonio Li, male    DOB: [birth year], 25 y.o.   MRN: [id number]  HPI He complains of a two-week history of sneezing, itchy watery eyes, nasal congestion , rhinorrhea.no fever, chills, sore throat or earache.he states that he does not remember this from last year. He has tried chlorpheniramine without success.  Review of Systems     Objective:   Physical Exam Alert and in no distress. Tympanic membranes difficult to see due to cerumen, canals are normal. Pharyngeal area is normal. Neck is supple without adenopathy or thyromegaly. Cardiac exam shows a regular sinus rhythm without murmurs or gallops. Lungs are clear to auscultation.        Assessment & Plan:  Allergic rhinitis due to pollen recommend he try either Allegra or Claritin as well as Flonase. Demonstrated proper use of Flonase. He is to call me in 2 weeks to let me know how it works.

## 2015-06-12 NOTE — Patient Instructions (Addendum)
Allergic Rhinitis Allergic rhinitis is when the mucous membranes in the nose respond to allergens. Allergens are particles in the air that cause your body to have an allergic reaction. This causes you to release allergic antibodies. Through a chain of events, these eventually cause you to release histamine into the blood stream. Although meant to protect the body, it is this release of histamine that causes your discomfort, such as frequent sneezing, congestion, and an itchy, runny nose.  CAUSES Seasonal allergic rhinitis (hay fever) is caused by pollen allergens that may come from grasses, trees, and weeds. Year-round allergic rhinitis (perennial allergic rhinitis) is caused by allergens such as house dust mites, pet dander, and mold spores. SYMPTOMS  Nasal stuffiness (congestion).  Itchy, runny nose with sneezing and tearing of the eyes. DIAGNOSIS Your health care provider can help you determine the allergen or allergens that trigger your symptoms. If you and your health care provider are unable to determine the allergen, skin or blood testing may be used. Your health care provider will diagnose your condition after taking your health history and performing a physical exam. Your health care provider may assess you for other related conditions, such as asthma, pink eye, or an ear infection. TREATMENT Allergic rhinitis does not have a cure, but it can be controlled by:  Medicines that block allergy symptoms. These may include allergy shots, nasal sprays, and oral antihistamines.  Avoiding the allergen. Hay fever may often be treated with antihistamines in pill or nasal spray forms. Antihistamines block the effects of histamine. There are over-the-counter medicines that may help with nasal congestion and swelling around the eyes. Check with your health care provider before taking or giving this medicine. If avoiding the allergen or the medicine prescribed do not work, there are many new medicines  your health care provider can prescribe. Stronger medicine may be used if initial measures are ineffective. Desensitizing injections can be used if medicine and avoidance does not work. Desensitization is when a patient is given ongoing shots until the body becomes less sensitive to the allergen. Make sure you follow up with your health care provider if problems continue. HOME CARE INSTRUCTIONS It is not possible to completely avoid allergens, but you can reduce your symptoms by taking steps to limit your exposure to them. It helps to know exactly what you are allergic to so that you can avoid your specific triggers. SEEK MEDICAL CARE IF:  You have a fever.  You develop a cough that does not stop easily (persistent).  You have shortness of breath.  You start wheezing.  Symptoms interfere with normal daily activities.   This information is not intended to replace advice given to you by your health care provider. Make sure you discuss any questions you have with your health care provider.   Document Released: 12/25/2000 Document Revised: 04/22/2014 Document Reviewed: 12/07/2012 Elsevier Interactive Patient Education 2016 [employer]. Use either Claritin or Careers adviser as well as Flonase nasal spray regularly for the next couple of weeks. If that doesn't work to let me know

## 2015-09-04 ENCOUNTER — Ambulatory Visit (INDEPENDENT_AMBULATORY_CARE_PROVIDER_SITE_OTHER): Payer: Medicaid Other | Admitting: Family Medicine

## 2015-09-04 VITALS — BP 102/62 | HR 68 | Temp 97.9°F | Ht 64.0 in | Wt 181.2 lb

## 2015-09-04 DIAGNOSIS — R05 Cough: Secondary | ICD-10-CM

## 2015-09-04 DIAGNOSIS — M545 Low back pain, unspecified: Secondary | ICD-10-CM

## 2015-09-04 DIAGNOSIS — R059 Cough, unspecified: Secondary | ICD-10-CM

## 2015-09-04 DIAGNOSIS — J069 Acute upper respiratory infection, unspecified: Secondary | ICD-10-CM | POA: Diagnosis not present

## 2015-09-04 DIAGNOSIS — M62830 Muscle spasm of back: Secondary | ICD-10-CM

## 2015-09-04 MED ORDER — AMOXICILLIN 500 MG PO TABS
1000.0000 mg | ORAL_TABLET | Freq: Two times a day (BID) | ORAL | Status: DC
Start: 2015-09-04 — End: 2018-09-06

## 2015-09-04 MED ORDER — METHOCARBAMOL 500 MG PO TABS
500.0000 mg | ORAL_TABLET | Freq: Four times a day (QID) | ORAL | Status: DC | PRN
Start: 2015-09-04 — End: 2018-09-07

## 2015-09-04 MED ORDER — BENZONATATE 200 MG PO CAPS: 200.0000 mg | ORAL_CAPSULE | Freq: Three times a day (TID) | ORAL | Status: DC | PRN

## 2015-09-04 MED ORDER — NAPROXEN 500 MG PO TABS: 500.0000 mg | ORAL_TABLET | Freq: Two times a day (BID) | ORAL | Status: DC

## 2015-09-04 NOTE — Patient Instructions (Signed)
   Drink plenty of fluids. Continue to take claritin every day, as there still appears to be evidence of allergies, which can contribute to the cough. If the mucus is thick (it sounds like it is), that will also make you cough more. I want you to be taking guaifenesin around the clock, as directed on the package (directions will be different depending on which type of mucus medication you have--some are every 4 hours, and some are every 12 hours--follow the directions on the package.  The ingredient should be guaifenesin). You can also use a sinus medication to help dry up the drainage from the nose even more than the claritin does.  You can either change to Claritin D (which contains pseudoephedrine) or you can buy a separate Sudafed (the PE kind is on the shelf at the pharmacy, or the pseudoephedrine is behind the counter).  Be sure that your mucus medication doesn't also contain either pseudoephedrine or phenylephrine.  If it does, then don't take both. Use the tessalon (benzonatate) if needed for cough. You may take this up to 3 times daily, if needed.  It shouldn't make you sleepy.  If you continue to have green mucus and phlegm after 2-3 days of using the claritin and guaifenesin (and/or sudafed) regularly, then start the antibiotic that was sent to the pharmacy (amoxicillin).  Do not fill or start that one right away.  I am refilling both the naproxen and the methocarbamol for you back pain. The naproxen will help with pain, and the methocarbamol is a muscle relaxant. Be sure to the naproxen with food.  Do not use ibuprofen, advil, motrin, Goody, BC or other pain medications along with the naproxen, as these act similarly. Be sure to take naproxen with food.   It is okay to use acetaminophen (tylenol) if needed for pain that isn't controlled by the naproxen.

## 2015-09-04 NOTE — Progress Notes (Signed)
Chief Complaint  Patient presents with  . Cough    started about a week ago, mucus in green in color. Unsure if he has had any fevers. Fatigued. Head hurts when he coughs a lot. Feels like he has a lot a drainage down the back of his throat.   . Soreness    this past weekend in [place]his entire body is sore and his right hip is bothering him, "clicking." Would like to know if he can have something for pain?   Complaining of cough x 1 week.  He has green mucus from his nose, drainage down the throat which causes him to cough.  He has taken claritin and a mucus medication, which hasn't helped much.  He continues to have some allergy symptoms--sneezing. Breathing in cold air also makes him cough. Denies fever.  Eyes feel tired/heavy.  No sinus pain. Some pain in the back of his head only when coughing a lot.  Denies any tick bites, fever, chills, rash  Complaining of some right sided low back pain.  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   He has h/o back pain, previously took robaxin and naproxen. He still has a few left at home, can't recall which ones. Requesting refill.  Denies having side effects from them. No numbness/tingling/weakness/bladder/bowel changes  PMH, PSH, SH reviewed  Outpatient Encounter Prescriptions as of 09/04/2015  Medication Sig Note  . guaiFENesin (MUCINEX) 600 MG 12 hr tablet Take 600 mg by mouth 2 (two) times daily.   Marland Kitchen. loratadine (CLARITIN) 10 MG tablet Take 10 mg by mouth daily.   . methocarbamol (ROBAXIN) 500 MG tablet Take 1-2 tablets (500-1,000 mg total) by mouth every 6 (six) hours as needed for muscle spasms.   . [DISCONTINUED] methocarbamol (ROBAXIN) 500 MG tablet Take 1-2 tablets (500-1,000 mg total) by mouth every 6 (six) hours as needed for muscle spasms.   Marland Kitchen. amoxicillin (AMOXIL) 500 MG tablet Take 2 tablets (1,000 mg total) by mouth 2 (two) times daily.   . benzonatate (TESSALON) 200 MG  capsule Take 1 capsule (200 mg total) by mouth 3 (three) times daily as needed.   . naproxen (NAPROSYN) 500 MG tablet Take 1 tablet (500 mg total) by mouth 2 (two) times daily with a meal.   . [DISCONTINUED] Chlorpheniramine Maleate (WAL-FINATE PO) Take by mouth.   . [DISCONTINUED] ibuprofen (ADVIL,MOTRIN) 200 MG tablet Take 200 mg by mouth every 6 (six) hours as needed. Reported on 06/12/2015 05/03/2015: Yesterday afternoon.  . [DISCONTINUED] naproxen (NAPROSYN) 500 MG tablet Take 1 tablet (500 mg total) by mouth 2 (two) times daily with a meal. (Patient not taking: Reported on 06/12/2015)    No facility-administered encounter medications on file as of 09/04/2015.   (tessalon, amox rx'd today)  No Known Allergies  ROS: no fever, chills, dizziness, syncope, chest pain, shortness of breath, ear pain, sore throat, nausea, vomiting, diarrhea, bleeding, bruising, rash, numbness, tingling or weakness.  See HPI.  PHYSICAL EXAM: BP 102/62 mmHg  Pulse 68  Temp(Src) 97.9 F (36.6 C) (Temporal)  Ht 5\' 4"  (1.626 m)  Wt 181 lb 3.2 oz (82.192 kg)  BMI 31.09 kg/m2   Well developed, pleasant male, with frequent cough, in no distress. Speaking easily in full sentences.  HEENT: PERRL, EOMI, conjunctiva and sclera are clear. Hearing device at right ear Nasal mucosa is moderately edematous, with clear mucus.  Mild erythema. No purulence noted.  OP has some mild erythema posteriorly.  No PND noted Neck: no lymphadenopathy or mass Heart: regular rate and rhythm without murmur Lungs: clear bilaterally, slightly course, but clears after cough. No wheezes, rales, ronchi Skin: intact without rashes or lesions Back: no spinal or CVA tenderness.  He has some tenderness and very mild spasm in the right paraspinous lower lumbar muscles DTR's 2+ and symmetric. Normal strength, gait Psych: normal mood, affect, hygiene and grooming  ASSESSMENT/PLAN:  Right-sided low back pain without sciatica - Plan: naproxen  (NAPROSYN) 500 MG tablet  Muscle spasm of back - Plan: methocarbamol (ROBAXIN) 500 MG tablet  Acute upper respiratory infection - Plan: amoxicillin (AMOXIL) 500 MG tablet  Cough - Plan: benzonatate (TESSALON) 200 MG capsule   Supportive measures reviewed. Start antibiotics if not improving in the next 2-3 days. Side effects of meds reviewed.   Drink plenty of fluids. Continue to take claritin every day, as there still appears to be evidence of allergies, which can contribute to the cough. If the mucus is thick (it sounds like it is), that will also make you cough more. I want you to be taking guaifenesin around the clock, as directed on the package (directions will be different depending on which type of mucus medication you have--some are every 4 hours, and some are every 12 hours--follow the directions on the package.  The ingredient should be guaifenesin). You can also use a sinus medication to help dry up the drainage from the nose even more than the claritin does.  You can either change to Claritin D (which contains pseudoephedrine) or you can buy a separate Sudafed (the PE kind is on the shelf at the pharmacy, or the pseudoephedrine is behind the counter).  Be sure that your mucus medication doesn't also contain either pseudoephedrine or phenylephrine.  If it does, then don't take both. Use the tessalon (benzonatate) if needed for cough. You may take this up to 3 times daily, if needed.  It shouldn't make you sleepy.  If you continue to have green mucus and phlegm after 2-3 days of using the claritin and guaifenesin (and/or sudafed) regularly, then start the antibiotic that was sent to the pharmacy (amoxicillin).  Do not fill or start that one right away.  I am refilling both the naproxen and the methocarbamol for you back pain. The naproxen will help with pain, and the methocarbamol is a muscle relaxant. Be sure to the naproxen with food.  Do not use ibuprofen, advil, motrin, Goody,  BC or other pain medications along with the naproxen, as these act similarly. Be sure to take naproxen with food.   It is okay to use acetaminophen (tylenol) if needed for pain that isn't controlled by the naproxen.

## 2015-09-05 ENCOUNTER — Encounter: Payer: Self-pay | Admitting: Family Medicine

## 2016-09-13 ENCOUNTER — Encounter: Payer: Self-pay | Admitting: Family Medicine

## 2016-09-13 ENCOUNTER — Ambulatory Visit (INDEPENDENT_AMBULATORY_CARE_PROVIDER_SITE_OTHER): Payer: Medicaid Other | Admitting: Family Medicine

## 2016-09-13 VITALS — BP 112/80 | HR 70 | Wt 183.0 lb

## 2016-09-13 DIAGNOSIS — R51 Headache: Secondary | ICD-10-CM | POA: Diagnosis not present

## 2016-09-13 DIAGNOSIS — R519 Headache, unspecified: Secondary | ICD-10-CM

## 2016-09-13 DIAGNOSIS — H5711 Ocular pain, right eye: Secondary | ICD-10-CM | POA: Diagnosis not present

## 2016-09-13 NOTE — Progress Notes (Signed)
   Subjective:    Patient ID: Antonio Li, male    DOB: 04/12/1991, 26 y.o.   MRN: [id number]  HPI He complains of a three-week history of right sided headache and visual changes complaining of blurred vision. He does have an ear implant on that side but states it is not giving him any trouble at all. He's had no double vision, nausea, vomiting, fever or chills.   Review of Systems     Objective:   Physical Exam Alert and in no distress. EOMI. Anterior chamber and cornea as well as conjunctiva appear normal. Visual acuity is recorded and is normal.       Assessment & Plan:  Eye pain, right - Plan: Ambulatory referral to Ophthalmology  Nonintractable headache, unspecified chronicity pattern, unspecified headache type - Plan: Ambulatory referral to Ophthalmology  The etiology of this is unclear. I will start with ophthalmology and proceed further based on their evaluation.

## 2016-09-19 ENCOUNTER — Encounter (HOSPITAL_COMMUNITY): Payer: Self-pay | Admitting: Vascular Surgery

## 2016-09-19 ENCOUNTER — Emergency Department (HOSPITAL_COMMUNITY)
Admission: EM | Admit: 2016-09-19 | Discharge: 2016-09-19 | Disposition: A | Discharge status: Home / Self Care | Payer: Medicaid Other | Attending: Emergency Medicine | Admitting: Emergency Medicine

## 2016-09-19 ENCOUNTER — Telehealth: Payer: Self-pay | Admitting: Family Medicine

## 2016-09-19 DIAGNOSIS — F17200 Nicotine dependence, unspecified, uncomplicated: Secondary | ICD-10-CM | POA: Insufficient documentation

## 2016-09-19 DIAGNOSIS — H547 Unspecified visual loss: Secondary | ICD-10-CM | POA: Diagnosis present

## 2016-09-19 DIAGNOSIS — Z9621 Cochlear implant status: Secondary | ICD-10-CM | POA: Diagnosis not present

## 2016-09-19 DIAGNOSIS — H05241 Constant exophthalmos, right eye: Secondary | ICD-10-CM | POA: Diagnosis not present

## 2016-09-19 DIAGNOSIS — H913 Deaf nonspeaking, not elsewhere classified: Secondary | ICD-10-CM | POA: Insufficient documentation

## 2016-09-19 DIAGNOSIS — H5711 Ocular pain, right eye: Secondary | ICD-10-CM | POA: Diagnosis not present

## 2016-09-19 LAB — BASIC METABOLIC PANEL
ANION GAP: 11 (ref 5–15)
BUN: 25 mg/dL — ABNORMAL HIGH (ref 6–20)
CO2: 21 mmol/L — ABNORMAL LOW (ref 22–32)
CREATININE: 1.2 mg/dL (ref 0.61–1.24)
Calcium: 9.4 mg/dL (ref 8.9–10.3)
Chloride: 102 mmol/L (ref 101–111)
Glucose, Bld: 85 mg/dL (ref 65–99)
Potassium: 4.1 mmol/L (ref 3.5–5.1)
SODIUM: 134 mmol/L — AB (ref 135–145)

## 2016-09-19 LAB — CBC WITH DIFFERENTIAL/PLATELET
BAND NEUTROPHILS: 5 %
BASOS ABS: 0 10*3/uL (ref 0.0–0.1)
BASOS PCT: 0 %
BLASTS: 0 %
EOS ABS: 0.2 10*3/uL (ref 0.0–0.7)
Eosinophils Relative: 3 %
HEMATOCRIT: 46.6 % (ref 39.0–52.0)
Hemoglobin: 15.7 g/dL (ref 13.0–17.0)
LYMPHS ABS: 2.7 10*3/uL (ref 0.7–4.0)
Lymphocytes Relative: 33 %
MCH: 27.6 pg (ref 26.0–34.0)
MCHC: 33.7 g/dL (ref 30.0–36.0)
MCV: 81.9 fL (ref 78.0–100.0)
METAMYELOCYTES PCT: 0 %
MONO ABS: 0.3 10*3/uL (ref 0.1–1.0)
MONOS PCT: 4 %
Myelocytes: 0 %
NEUTROS ABS: 4.3 10*3/uL (ref 1.7–7.7)
Neutrophils Relative %: 47 %
Other: 8 %
PLATELETS: 215 10*3/uL (ref 150–400)
Promyelocytes Absolute: 0 %
RBC: 5.69 MIL/uL (ref 4.22–5.81)
RDW: 13.1 % (ref 11.5–15.5)
WBC: 8.2 10*3/uL (ref 4.0–10.5)
nRBC: 0 /100 WBC

## 2016-09-19 MED ORDER — PREDNISONE 10 MG (21) PO TBPK: ORAL_TABLET | Freq: Every day | ORAL | 0 refills | Status: DC

## 2016-09-19 NOTE — ED Notes (Signed)
ED physician at bedside.

## 2016-09-19 NOTE — Discharge Instructions (Signed)
You were seen in the ED today with right vision pain and loss. We wanted to perform an MRI but are unsure if your cochlear implant is MRI compatible. We do not want to cause harm to you or your implant with the MRI. Our Neurologist here would like to start you on steroid and follow up with your PCP and Neurologist for possible outpatient MRI or to schedule further testing.   Return to the ED with any worsening pain, fever, chills, or worsening vision change.

## 2016-09-19 NOTE — ED Triage Notes (Signed)
Pt reports to the ED for eval of right eye blindness x 3 weeks. He went to the opthalmologist today who referred him here. He states they were unable to determine the problem and sent him here. He endorses some eye pain to the right eyes. His pupils are dilated he did have drops placed. Rt eye pupil appears sluggish/nonreactive. Eyes are sensitive to light.

## 2016-09-19 NOTE — ED Notes (Signed)
MRI made Koleen NimrodAdrian RN and this RN aware of the lack of information on patient's cochlear implant.  Patient does not have card with implant information on it.  Patient had CT done for knee injury prior with notes stating "CT done due to implant".  Long MD made aware and consulting neurology.

## 2016-09-19 NOTE — ED Notes (Signed)
Patient Alert and oriented X4. Stable and ambulatory. Patient verbalized understanding of the discharge instructions.  Patient belongings were taken by the patient.  

## 2016-09-19 NOTE — Telephone Encounter (Signed)
Dr. Laruth BouchardGroat's office called and stated that pt was in office today for right eye pain. He is being sent to the ER for a full Neuro Eval with a MRI. At  The eye doctor he was discovered to have disc edema in right eye but disc in both eyes are somewhat elevated. Dr. Dione BoozeGroat can be reached at [phone].

## 2016-09-19 NOTE — ED Provider Notes (Signed)
Emergency Department Provider Note   I have reviewed the triage vital signs and the nursing notes.   HISTORY  Chief Complaint Eye Problem   HPI Antonio Li is a 26 y.o. male with PMH of deafness s/p cochlear implant presents to the emergency department for evaluation of throbbing right eye pain and color vision loss over the last 3 weeks. Symptoms have gradually worsened. He was seen by ophthalmology today for dilated eye exam but was told there is nothing that can find on exam to explain his symptoms. He was referred to the emergency department for further evaluation. The patient has continued throbbing right eye pain that radiates to the posterior right side of his scalp. His cochlear implant was [facility] at [facility] in 2004. His other complications from the device since placement. No fever, chills, or head trauma. No modifying factors.    Past Medical History:  Diagnosis Date  . Deafness     Patient Active Problem List   Diagnosis Date Noted  . Allergic rhinitis due to pollen 06/12/2015    Past Surgical History:  Procedure Laterality Date  . COCHLEAR IMPLANT  2004    Current Outpatient Rx  . Order #: [id number] Class: Normal  . Order #: [id number] Class: Normal  . Order #: [id number] Class: Historical Med  . Order #: [id number] Class: Historical Med  . Order #: [id number] Class: Normal  . Order #: [id number] Class: Normal  . Order #: [id number] Class: Print    Allergies Patient has no known allergies.  No family history on file.  Social History Social History  Substance Use Topics  . Smoking status: Former Smoker    Quit date: 04/15/2009  . Smokeless tobacco: Never Used  . Alcohol use 0.0 oz/week     Comment: socially 1-2 drinks once a month maybe (weekends)    Review of Systems  Constitutional: No fever/chills Eyes: Positive visual changes. ENT: No sore throat. Cardiovascular: Denies chest pain. Respiratory: Denies shortness of breath. Gastrointestinal: No abdominal pain.  No  nausea, no vomiting.  No diarrhea.  No constipation. Genitourinary: Negative for dysuria. Musculoskeletal: Negative for back pain. Skin: Negative for rash. Neurological: Negative for focal weakness or numbness. Positive right sided HA.   10-point ROS otherwise negative.  ____________________________________________   PHYSICAL EXAM:  VITAL SIGNS: ED Triage Vitals  Enc Vitals Group     BP 09/19/16 1431 111/82     Pulse Rate 09/19/16 1431 83     Resp 09/19/16 1431 16     Temp 09/19/16 1431 98.8 F (37.1 C)     Temp Source 09/19/16 1431 Oral     SpO2 09/19/16 1431 98 %     Pain Score 09/19/16 1430 5   Constitutional: Alert and oriented. Well appearing and in no acute distress. Eyes: Conjunctivae are normal. Bilateral pupil dilation (8mm and sluggish). No ptosis.  Head: Atraumatic. Nose: No congestion/rhinnorhea. Mouth/Throat: Mucous membranes are moist.  Oropharynx non-erythematous. Neck: No stridor.  Cardiovascular: Normal rate, regular rhythm. Good peripheral circulation. Grossly normal heart sounds.   Respiratory: Normal respiratory effort.  No retractions. Lungs CTAB. Gastrointestinal: Soft and nontender. No distention.  Musculoskeletal: No lower extremity tenderness nor edema. No gross deformities of extremities. Neurologic:  Normal speech and language. No gross focal neurologic deficits are appreciated.  Skin:  Skin is warm, dry and intact. No rash noted.  ____________________________________________   LABS (all labs ordered are listed, but only abnormal results are displayed)  Labs Reviewed  BASIC METABOLIC PANEL - Abnormal; Notable for the following:  Result Value   Sodium 134 (*)    CO2 21 (*)    BUN 25 (*)    All other components within normal limits  CBC WITH DIFFERENTIAL/PLATELET   ____________________________________________  RADIOLOGY  Unable to obtain MRI brain and  orbits ____________________________________________   PROCEDURES  Procedure(s) performed:   Procedures  None ____________________________________________   INITIAL IMPRESSION / ASSESSMENT AND PLAN / ED COURSE  Pertinent labs & imaging results that were available during my care of the patient were reviewed by me and considered in my medical decision making (see chart for details).  Patient presents to the emergency department for evaluation of right eye throbbing pain and decreased color vision. He was evaluated by ophthalmology today with no abnormalities on dilated exam. Discussed patient's cochlear implant with radiology regarding MRI compliance. Plan for MRI of the brain and orbits with and without contrast. No other focal neurological deficits.  04:36 PM Radiology cannot perform MRI on the patient due to his implant. He does not have the card with him that describes the device. Discussed case with Neurology Dr. Roxy Mannsster who does not feel that CT will add any diagnostic value. Recommends steroid taper and outpatient Neurology for follow up and consideration of outpatient MRI if implant is found to indeed be compliant with MRI. [facility] for information on the device but they are closed.   ____________________________________________  FINAL CLINICAL IMPRESSION(S) / ED DIAGNOSES  Final diagnoses:  Vision problem     MEDICATIONS GIVEN DURING THIS VISIT:  None  NEW OUTPATIENT MEDICATIONS STARTED DURING THIS VISIT:  New Prescriptions   PREDNISONE (STERAPRED UNI-PAK 21 TAB) 10 MG (21) TBPK TABLET    Take by mouth daily. Take 6 tabs by mouth daily  for 2 days, then 5 tabs for 2 days, then 4 tabs for 2 days, then 3 tabs for 2 days, 2 tabs for 2 days, then 1 tab by mouth daily for 2 days      Note:  This document was prepared using Dragon voice recognition software and may include unintentional dictation errors.  Alona BeneJoshua Ceili Boshers, MD Emergency  Medicine    Betina Puckett, Arlyss RepressJoshua G, MD 09/19/16 [phone]

## 2016-09-19 NOTE — ED Notes (Signed)
Pt states that when the pain starts in in the head his right starts to hurt really bad.

## 2016-09-19 NOTE — ED Notes (Signed)
PT has a cochlear implant on right side placed back in 2004

## 2016-09-23 DIAGNOSIS — Z9621 Cochlear implant status: Secondary | ICD-10-CM | POA: Diagnosis not present

## 2017-12-23 DIAGNOSIS — H903 Sensorineural hearing loss, bilateral: Secondary | ICD-10-CM | POA: Diagnosis not present

## 2017-12-31 DIAGNOSIS — H903 Sensorineural hearing loss, bilateral: Secondary | ICD-10-CM | POA: Diagnosis not present

## 2018-09-06 ENCOUNTER — Emergency Department (HOSPITAL_COMMUNITY): Payer: Medicaid Other

## 2018-09-06 ENCOUNTER — Other Ambulatory Visit: Payer: Self-pay

## 2018-09-06 ENCOUNTER — Inpatient Hospital Stay (HOSPITAL_COMMUNITY)
Admission: EM | Admit: 2018-09-06 | Discharge: 2018-09-12 | DRG: 178 | Disposition: A | Discharge status: Home / Self Care | Payer: Medicaid Other | Attending: Internal Medicine | Admitting: Internal Medicine

## 2018-09-06 ENCOUNTER — Encounter (HOSPITAL_COMMUNITY): Payer: Self-pay | Admitting: Emergency Medicine

## 2018-09-06 DIAGNOSIS — U071 COVID-19: Secondary | ICD-10-CM | POA: Diagnosis not present

## 2018-09-06 DIAGNOSIS — R918 Other nonspecific abnormal finding of lung field: Secondary | ICD-10-CM | POA: Diagnosis not present

## 2018-09-06 DIAGNOSIS — N289 Disorder of kidney and ureter, unspecified: Secondary | ICD-10-CM

## 2018-09-06 DIAGNOSIS — R0603 Acute respiratory distress: Secondary | ICD-10-CM | POA: Diagnosis not present

## 2018-09-06 DIAGNOSIS — H905 Unspecified sensorineural hearing loss: Secondary | ICD-10-CM | POA: Diagnosis present

## 2018-09-06 DIAGNOSIS — R509 Fever, unspecified: Secondary | ICD-10-CM | POA: Diagnosis present

## 2018-09-06 DIAGNOSIS — J029 Acute pharyngitis, unspecified: Secondary | ICD-10-CM | POA: Diagnosis present

## 2018-09-06 DIAGNOSIS — Z79899 Other long term (current) drug therapy: Secondary | ICD-10-CM | POA: Diagnosis not present

## 2018-09-06 DIAGNOSIS — R0602 Shortness of breath: Secondary | ICD-10-CM | POA: Diagnosis not present

## 2018-09-06 DIAGNOSIS — J069 Acute upper respiratory infection, unspecified: Secondary | ICD-10-CM | POA: Diagnosis not present

## 2018-09-06 DIAGNOSIS — E860 Dehydration: Secondary | ICD-10-CM | POA: Diagnosis present

## 2018-09-06 DIAGNOSIS — N179 Acute kidney failure, unspecified: Secondary | ICD-10-CM | POA: Diagnosis not present

## 2018-09-06 DIAGNOSIS — Z87891 Personal history of nicotine dependence: Secondary | ICD-10-CM

## 2018-09-06 DIAGNOSIS — E861 Hypovolemia: Secondary | ICD-10-CM | POA: Diagnosis present

## 2018-09-06 DIAGNOSIS — Z9621 Cochlear implant status: Secondary | ICD-10-CM | POA: Diagnosis not present

## 2018-09-06 DIAGNOSIS — K5900 Constipation, unspecified: Secondary | ICD-10-CM | POA: Diagnosis present

## 2018-09-06 DIAGNOSIS — R05 Cough: Secondary | ICD-10-CM

## 2018-09-06 DIAGNOSIS — R531 Weakness: Secondary | ICD-10-CM

## 2018-09-06 DIAGNOSIS — E871 Hypo-osmolality and hyponatremia: Secondary | ICD-10-CM | POA: Diagnosis not present

## 2018-09-06 DIAGNOSIS — R0600 Dyspnea, unspecified: Secondary | ICD-10-CM

## 2018-09-06 DIAGNOSIS — J988 Other specified respiratory disorders: Secondary | ICD-10-CM | POA: Diagnosis not present

## 2018-09-06 DIAGNOSIS — R059 Cough, unspecified: Secondary | ICD-10-CM

## 2018-09-06 LAB — CBC WITH DIFFERENTIAL/PLATELET
Abs Immature Granulocytes: 0.02 10*3/uL (ref 0.00–0.07)
Basophils Absolute: 0 10*3/uL (ref 0.0–0.1)
Basophils Relative: 0 %
Eosinophils Absolute: 0 10*3/uL (ref 0.0–0.5)
Eosinophils Relative: 0 %
HCT: 46.2 % (ref 39.0–52.0)
Hemoglobin: 15.3 g/dL (ref 13.0–17.0)
Immature Granulocytes: 1 %
Lymphocytes Relative: 29 %
Lymphs Abs: 1.3 10*3/uL (ref 0.7–4.0)
MCH: 27.7 pg (ref 26.0–34.0)
MCHC: 33.1 g/dL (ref 30.0–36.0)
MCV: 83.5 fL (ref 80.0–100.0)
Monocytes Absolute: 0.5 10*3/uL (ref 0.1–1.0)
Monocytes Relative: 12 %
Neutro Abs: 2.6 10*3/uL (ref 1.7–7.7)
Neutrophils Relative %: 58 %
Platelets: 193 10*3/uL (ref 150–400)
RBC: 5.53 MIL/uL (ref 4.22–5.81)
RDW: 12.6 % (ref 11.5–15.5)
WBC: 4.4 10*3/uL (ref 4.0–10.5)
nRBC: 0 % (ref 0.0–0.2)

## 2018-09-06 LAB — TROPONIN I: Troponin I: <0.03 ng/mL (ref <0.03)

## 2018-09-06 LAB — TRIGLYCERIDES: Triglycerides: 92 mg/dL (ref <150)

## 2018-09-06 LAB — FIBRINOGEN: Fibrinogen: 458 mg/dL (ref 210–475)

## 2018-09-06 LAB — COMPREHENSIVE METABOLIC PANEL
ALT: 57 U/L — ABNORMAL HIGH (ref 0–44)
AST: 55 U/L — ABNORMAL HIGH (ref 15–41)
Albumin: 3.6 g/dL (ref 3.5–5.0)
Alkaline Phosphatase: 78 U/L (ref 38–126)
Anion gap: 11 (ref 5–15)
BUN: 13 mg/dL (ref 6–20)
CO2: 20 mmol/L — ABNORMAL LOW (ref 22–32)
Calcium: 8.2 mg/dL — ABNORMAL LOW (ref 8.9–10.3)
Chloride: 98 mmol/L (ref 98–111)
Creatinine, Ser: 1.38 mg/dL — ABNORMAL HIGH (ref 0.61–1.24)
GFR calc Af Amer: >60 mL/min (ref >60)
GFR calc non Af Amer: >60 mL/min (ref >60)
Glucose, Bld: 110 mg/dL — ABNORMAL HIGH (ref 70–99)
Potassium: 4.4 mmol/L (ref 3.5–5.1)
Sodium: 129 mmol/L — ABNORMAL LOW (ref 135–145)
Total Bilirubin: 0.9 mg/dL (ref 0.3–1.2)
Total Protein: 7.4 g/dL (ref 6.5–8.1)

## 2018-09-06 LAB — URINALYSIS, ROUTINE W REFLEX MICROSCOPIC
Bilirubin Urine: NEGATIVE
Glucose, UA: NEGATIVE mg/dL
Hgb urine dipstick: NEGATIVE
Ketones, ur: NEGATIVE mg/dL
Leukocytes,Ua: NEGATIVE
Nitrite: NEGATIVE
Protein, ur: NEGATIVE mg/dL
Specific Gravity, Urine: 1.006 (ref 1.005–1.030)
pH: 6 (ref 5.0–8.0)

## 2018-09-06 LAB — BRAIN NATRIURETIC PEPTIDE: B Natriuretic Peptide: 4.5 pg/mL (ref 0.0–100.0)

## 2018-09-06 LAB — D-DIMER, QUANTITATIVE: D-Dimer, Quant: 0.51 ug/mL-FEU — ABNORMAL HIGH (ref 0.00–0.50)

## 2018-09-06 LAB — LACTIC ACID, PLASMA
Lactic Acid, Venous: 2.2 mmol/L (ref 0.5–1.9)
Lactic Acid, Venous: 2.1 mmol/L (ref 0.5–1.9)

## 2018-09-06 LAB — SARS CORONAVIRUS 2 BY RT PCR (HOSPITAL ORDER, PERFORMED IN ~~LOC~~ HOSPITAL LAB): SARS Coronavirus 2: POSITIVE — AB

## 2018-09-06 LAB — LACTATE DEHYDROGENASE: LDH: 136 U/L (ref 98–192)

## 2018-09-06 LAB — C-REACTIVE PROTEIN: CRP: 0.8 mg/dL (ref <1.0)

## 2018-09-06 LAB — GROUP A STREP BY PCR: Group A Strep by PCR: NOT DETECTED

## 2018-09-06 LAB — PROCALCITONIN: Procalcitonin: <0.1 ng/mL

## 2018-09-06 LAB — FERRITIN: Ferritin: 229 ng/mL (ref 24–336)

## 2018-09-06 MED ORDER — ENOXAPARIN SODIUM 40 MG/0.4ML ~~LOC~~ SOLN
40.0000 mg | SUBCUTANEOUS | Status: DC
  Administered 2018-09-07 – 2018-09-12 (×6): 40 mg via SUBCUTANEOUS
  Filled 2018-09-06 (×6): qty 0.4

## 2018-09-06 MED ORDER — MORPHINE SULFATE (PF) 4 MG/ML IV SOLN
4.0000 mg | INTRAVENOUS | Status: DC | PRN
  Administered 2018-09-06: 4 mg via INTRAVENOUS
  Filled 2018-09-06: qty 1

## 2018-09-06 MED ORDER — ONDANSETRON HCL 4 MG/2ML IJ SOLN
4.0000 mg | Freq: Four times a day (QID) | INTRAMUSCULAR | Status: DC | PRN
  Administered 2018-09-11: 4 mg via INTRAVENOUS
  Filled 2018-09-06: qty 2

## 2018-09-06 MED ORDER — SODIUM CHLORIDE 0.9 % IV SOLN: 250.0000 mL | INTRAVENOUS | Status: DC | PRN

## 2018-09-06 MED ORDER — LACTATED RINGERS IV SOLN: INTRAVENOUS | Status: DC

## 2018-09-06 MED ORDER — ACETAMINOPHEN 325 MG PO TABS
650.0000 mg | ORAL_TABLET | Freq: Four times a day (QID) | ORAL | Status: DC | PRN
  Administered 2018-09-07 – 2018-09-11 (×9): 650 mg via ORAL
  Filled 2018-09-06 (×9): qty 2

## 2018-09-06 MED ORDER — SODIUM CHLORIDE 0.9% FLUSH
3.0000 mL | Freq: Two times a day (BID) | INTRAVENOUS | Status: DC
  Administered 2018-09-07 – 2018-09-12 (×12): 3 mL via INTRAVENOUS

## 2018-09-06 MED ORDER — SODIUM CHLORIDE 0.9% FLUSH: 3.0000 mL | INTRAVENOUS | Status: DC | PRN

## 2018-09-06 MED ORDER — SODIUM CHLORIDE 0.9 % IV SOLN
2.0000 g | INTRAVENOUS | Status: DC
  Administered 2018-09-06: 21:00:00 2 g via INTRAVENOUS
  Filled 2018-09-06: qty 20

## 2018-09-06 MED ORDER — ACETAMINOPHEN 325 MG PO TABS
650.0000 mg | ORAL_TABLET | Freq: Once | ORAL | Status: AC
  Administered 2018-09-06: 650 mg via ORAL
  Filled 2018-09-06: qty 2

## 2018-09-06 MED ORDER — ALBUTEROL SULFATE HFA 108 (90 BASE) MCG/ACT IN AERS
2.0000 | INHALATION_SPRAY | Freq: Once | RESPIRATORY_TRACT | Status: AC
  Administered 2018-09-06: 2 via RESPIRATORY_TRACT
  Filled 2018-09-06: qty 6.7

## 2018-09-06 MED ORDER — ONDANSETRON HCL 4 MG PO TABS: 4.0000 mg | ORAL_TABLET | Freq: Four times a day (QID) | ORAL | Status: DC | PRN

## 2018-09-06 MED ORDER — HYDROCODONE-ACETAMINOPHEN 5-325 MG PO TABS
1.0000 | ORAL_TABLET | ORAL | Status: DC | PRN
  Administered 2018-09-09 – 2018-09-11 (×2): 2 via ORAL
  Filled 2018-09-06 (×2): qty 2

## 2018-09-06 MED ORDER — SODIUM CHLORIDE 0.9% FLUSH
3.0000 mL | Freq: Two times a day (BID) | INTRAVENOUS | Status: DC
  Administered 2018-09-07 – 2018-09-10 (×8): 3 mL via INTRAVENOUS

## 2018-09-06 MED ORDER — SODIUM CHLORIDE 0.9 % IV BOLUS
1000.0000 mL | Freq: Once | INTRAVENOUS | Status: AC
  Administered 2018-09-06: 1000 mL via INTRAVENOUS

## 2018-09-06 MED ORDER — SODIUM CHLORIDE 0.9 % IV SOLN
1000.0000 mL | INTRAVENOUS | Status: DC
  Administered 2018-09-06: 1000 mL via INTRAVENOUS

## 2018-09-06 MED ORDER — POLYETHYLENE GLYCOL 3350 17 G PO PACK: 17.0000 g | PACK | Freq: Every day | ORAL | Status: DC | PRN

## 2018-09-06 MED ORDER — SODIUM CHLORIDE 0.9 % IV SOLN
500.0000 mg | INTRAVENOUS | Status: DC
  Administered 2018-09-06: 500 mg via INTRAVENOUS
  Filled 2018-09-06: qty 500

## 2018-09-06 NOTE — ED Notes (Signed)
EDP Alyssa notified of critical lab

## 2018-09-06 NOTE — ED Notes (Signed)
Pt now has cochlear implant battery installed and can hear and understand spoken words.  Pt did given permission to speak with family regarding his condition

## 2018-09-06 NOTE — ED Triage Notes (Signed)
Patient presents with complaints of weakness , difficulty swallowing, sore throat, cough and chest pain since Wednesday. Denies sick contacts at home. Reports episodes of vomiting Wednesday and Thursday. Reports waking up sweating and not being able to sleep.

## 2018-09-06 NOTE — ED Notes (Signed)
asl interpretation used to discuss plan of care. Pt sister called, she will be bringing pt hearing Print production planner

## 2018-09-06 NOTE — ED Notes (Signed)
[facility] called for [facility] to [facility]

## 2018-09-06 NOTE — ED Triage Notes (Signed)
Also reports headache.

## 2018-09-06 NOTE — H&P (Signed)
History and Physical    Antonio Li ZOX:[id number] DOB: [birth year] DOA: 09/06/2018  PCP: Ronnald Nian, MD   Patient coming from: Home   Chief Complaint: Fevers, non-productive cough, SOB, aches, malaise, nausea   HPI: Antonio Li is a 28 y.o. 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  Symptoms began approximately 5 days ago, have continued to worsen despite his use of over-the-counter antipyretics.  The patient's primary complaints include a profound fatigue with generalized aches and weakness, but denies any focal weakness.  He has some chest discomfort with deep inspiration or cough.  Denies any rash or wounds, denies abdominal pain, and denies any loss of consciousness.  He has been taking antipyretics daily.   ED Course: Upon arrival to the ED, patient is found to be febrile to 39.4 C, tachycardic in the 120s, tachypneic in the mid 40s, and with stable blood pressure.  EKG demonstrates sinus rhythm with rate 90.  Chest x-ray is notable for low lung volumes but otherwise unremarkable.  Chemistry panel features sodium of 129, slight elevation in transaminases, and creatinine 1.38.  CBC is unremarkable with no leukopenia.  Group A strep is negative and COVID-19 positive.  Lactic acid is 2.2.  Patient was given Tylenol, albuterol, 1 L of normal saline, Rocephin, and azithromycin in the ED.  Patient remains markedly tachypneic and dyspneic at rest and will be admitted for ongoing evaluation and management.  Review of Systems:  All other systems reviewed and apart from HPI, are negative.  Past Medical History:  Diagnosis Date  . Deafness     Past Surgical History:  Procedure Laterality Date  . COCHLEAR IMPLANT  2004     reports that he quit smoking about 9 years ago. He has never used smokeless  tobacco. He reports current alcohol use. He reports that he does not use drugs.  No Known Allergies  History reviewed. No pertinent family history.   Prior to Admission medications   Medication Sig Start Date End Date Taking? Authorizing Provider  amoxicillin (AMOXIL) 500 MG tablet Take 2 tablets (1,000 mg total) by mouth 2 (two) times daily. Patient not taking: Reported on 09/13/2016 09/04/15   Joselyn Arrow, MD  benzonatate (TESSALON) 200 MG capsule Take 1 capsule (200 mg total) by mouth 3 (three) times daily as needed. Patient not taking: Reported on 09/13/2016 09/04/15   Joselyn Arrow, MD  guaiFENesin (MUCINEX) 600 MG 12 hr tablet Take 600 mg by mouth 2 (two) times daily.    [provider]  loratadine (CLARITIN) 10 MG tablet Take 10 mg by mouth daily.    [provider]  methocarbamol (ROBAXIN) 500 MG tablet Take 1-2 tablets (500-1,000 mg total) by mouth every 6 (six) hours as needed for muscle spasms. 09/04/15   Joselyn Arrow, MD  naproxen (NAPROSYN) 500 MG tablet Take 1 tablet (500 mg total) by mouth 2 (two) times daily with a meal. Patient not taking: Reported on 09/13/2016 09/04/15   Joselyn Arrow, MD  predniSONE (STERAPRED UNI-PAK 21 TAB) 10 MG (21) TBPK tablet Take by mouth daily. Take 6 tabs by mouth daily  for 2 days, then 5 tabs for 2 days, then 4 tabs for 2 days, then 3 tabs for 2 days, 2 tabs for 2 days, then 1 tab by mouth daily for 2 days 09/19/16   Long, Arlyss Repress, MD  Physical Exam: Vitals:   09/06/18 2000 09/06/18 2033 09/06/18 2042 09/06/18 2057  BP:  124/70    Pulse: 90 94 98 (!) 102  Resp: (!) 44 (!) 31 (!) 36 (!) 41  Temp:   (!) 101.7 F (38.7 C)   TempSrc:   Oral   SpO2: 97% 97% 98% 99%  Weight:      Height:        Constitutional: Tachypneic, in apparent discomfort, no pallor, no diaphoresis Eyes: PERTLA, lids and conjunctivae normal ENMT: Mucous membranes are moist. Posterior pharynx clear of any exudate or lesions.   Neck: supple, no masses, no thyromegaly  Respiratory: Tachypneic. Speaking full sentances. No pallor or cyanosis.  Cardiovascular: Rate ~110 and regular. No extremity edema.   Abdomen: No distension, no tenderness, soft. Bowel sounds active.  Musculoskeletal: no clubbing / cyanosis. No joint deformity upper and lower extremities.    Skin: no significant rashes, lesions, ulcers. Warm, dry, well-perfused. Neurologic: No facial asymmetry. Gross hearing loss. Sensation intact. Moving all extremities.  Psychiatric: Alert and oriented to person, place, and situation. Pleasant, cooperative.    Labs on Admission: I have personally reviewed following labs and imaging studies  CBC: Recent Labs  Lab 09/06/18 1740  WBC 4.4  NEUTROABS 2.6  HGB 15.3  HCT 46.2  MCV 83.5  PLT 193   Basic Metabolic Panel: Recent Labs  Lab 09/06/18 1740  NA 129*  K 4.4  CL 98  CO2 20*  GLUCOSE 110*  BUN 13  CREATININE 1.38*  CALCIUM 8.2*   GFR: Estimated Creatinine Clearance: 78 mL/min (A) (by C-G formula based on SCr of 1.38 mg/dL (H)). Liver Function Tests: Recent Labs  Lab 09/06/18 1740  AST 55*  ALT 57*  ALKPHOS 78  BILITOT 0.9  PROT 7.4  ALBUMIN 3.6   No results for input(s): LIPASE, AMYLASE in the last 168 hours. No results for input(s): AMMONIA in the last 168 hours. Coagulation Profile: No results for input(s): INR, PROTIME in the last 168 hours. Cardiac Enzymes: No results for input(s): CKTOTAL, CKMB, CKMBINDEX, TROPONINI in the last 168 hours. BNP (last 3 results) No results for input(s): PROBNP in the last 8760 hours. HbA1C: No results for input(s): HGBA1C in the last 72 hours. CBG: No results for input(s): GLUCAP in the last 168 hours. Lipid Profile: No results for input(s): CHOL, HDL, LDLCALC, TRIG, CHOLHDL, LDLDIRECT in the last 72 hours. Thyroid Function Tests: No results for input(s): TSH, T4TOTAL, FREET4, T3FREE, THYROIDAB in the last 72 hours. Anemia Panel: No results for input(s): VITAMINB12, FOLATE,  FERRITIN, TIBC, IRON, RETICCTPCT in the last 72 hours. Urine analysis: No results found for: COLORURINE, APPEARANCEUR, LABSPEC, PHURINE, GLUCOSEU, HGBUR, BILIRUBINUR, KETONESUR, PROTEINUR, UROBILINOGEN, NITRITE, LEUKOCYTESUR Sepsis Labs: @LABRCNTIP (procalcitonin:4,lacticidven:4) ) Recent Results (from the past 240 hour(s))  Group A Strep by PCR     Status: None   Collection Time: 09/06/18  5:17 PM  Result Value Ref Range Status   Group A Strep by PCR NOT DETECTED NOT DETECTED Final    Comment: Performed at [facility], [address] [place], [place] [address]  SARS Coronavirus 2 [facility] order, Performed in [facility] hospital lab)     Status: Abnormal   Collection Time: 09/06/18  5:41 PM  Result Value Ref Range Status   SARS Coronavirus 2 POSITIVE (A) NEGATIVE Final    Comment: RESULT CALLED TO, READ BACK BY AND VERIFIED WITH: RN A DENNIS [id number] 2002 MLM (NOTE) If result is NEGATIVE SARS-CoV-2 target  nucleic acids are NOT DETECTED. The SARS-CoV-2 RNA is generally detectable in upper and lower  respiratory specimens during the acute phase of infection. The lowest  concentration of SARS-CoV-2 viral copies this assay can detect is 250  copies / mL. A negative result does not preclude SARS-CoV-2 infection  and should not be used as the sole basis for treatment or other  patient management decisions.  A negative result may occur with  improper specimen collection / handling, submission of specimen other  than nasopharyngeal swab, presence of viral mutation(s) within the  areas targeted by this assay, and inadequate number of viral copies  (<250 copies / mL). A negative result must be combined with clinical  observations, patient history, and epidemiological information. If result is POSITIVE SARS-CoV-2 target nucleic acids are DETECTED. The SARS- CoV-2 RNA is generally detectable in upper and lower  respiratory specimens during the acute phase of infection.  Positive   results are indicative of active infection with SARS-CoV-2.  Clinical  correlation with patient history and other diagnostic information is  necessary to determine patient infection status.  Positive results do  not rule out bacterial infection or co-infection with other viruses. If result is PRESUMPTIVE POSTIVE SARS-CoV-2 nucleic acids MAY BE PRESENT.   A presumptive positive result was obtained on the submitted specimen  and confirmed on repeat testing.  While 2019 novel coronavirus  (SARS-CoV-2) nucleic acids may be present in the submitted sample  additional confirmatory testing may be necessary for epidemiological  and / or clinical management purposes  to differentiate between  SARS-CoV-2 and other Sarbecovirus currently known to infect humans.  If clinically indicated additional testing with an alternate test  methodology [phone]) is advised . The SARS-CoV-2 RNA is generally  detectable in upper and lower respiratory specimens during the acute  phase of infection. The expected result is Negative. Fact Sheet for Patients:  BoilerBrush.com.cy Fact Sheet for Healthcare Providers: https://pope.com/ This test is not yet approved or cleared by the [place] FDA and has been authorized for detection and/or diagnosis of SARS-CoV-2 by FDA under an Emergency Use Authorization (EUA).  This EUA will remain in effect (meaning this test can be used) for the duration of the COVID-19 declaration under Section 564(b)(1) of the Act, 21 U.S.C. section 360bbb-3(b)(1), unless the authorization is terminated or revoked sooner. Performed at [facility], [address], [place], [place] [address]      Radiological Exams on Admission: Dg Chest Portable 1 View  Result Date: 09/06/2018 CLINICAL DATA:  Cough and fever EXAM: PORTABLE CHEST 1 VIEW COMPARISON:  Chest x-ray dated 01/15/2008 FINDINGS: Low lung volumes. No pneumothorax. No  significant pleural effusion. The heart size is unremarkable for the low lung volumes. There is no acute osseous abnormality detected. IMPRESSION: Low lung volumes, otherwise unremarkable chest x-ray. Electronically Signed   By: Katherine Mantle M.D.   On: 09/06/2018 19:02    EKG: Independently reviewed. Sinus rhythm, rate 90.   Assessment/Plan   1. Acute respiratory disease secondary to COVID-19  - Presents with 5 days of fever, malaise, non-productive cough, and SOB  - Found to have COVID-19, no hypoxia but with respiratory rate in 40's at rest  - He reports some improvement with APAP and IVF in ED, but RR remains 40 while laying in bed and he is at high risk for decompensation  - Check inflammatory markers, consider continuing abx if procalcitonin high, continue supportive care, droplet and contact precautions   2. Hyponatremia  -  Serum sodium is 129 in setting of hypovolemia  - He was given a liter of NS in ED  - Check urine sodium and urine osm, repeat chem panel in am   3. Mild renal insufficiency  - SCr is 1.38 on admission; was 1.20 in 2018  - He is hypovolemic and was given 1 liter NS bolus in ED  - Repeat chem panel in am     PPE: Mask, face shield, gloves, gown. Patient wearing mask.  DVT prophylaxis: Lovenox  Code Status: Full  Disposition Plan: Discussed with patient  Admission status: Inpatient     Briscoe Deutscherimothy S Naomee Nowland, MD [facility] Pager [phone]  If 7PM-7AM, please contact night-coverage [email or web address] Password [email or web address]  09/06/2018, 9:13 PM

## 2018-09-06 NOTE — ED Provider Notes (Signed)
[facility] Provider Note   CSN: [id number] Arrival date & time: 09/06/18  1554    History   Chief Complaint Chief Complaint  Patient presents with  . Weakness    HPI Burnard HawthorneHung Taitano is a 28 y.o. male.     HPI  Patient is a 28 year old male with past medical history of deafness and right cochlear implant presenting for myalgias, fever, sore throat, nonproductive cough, nausea and vomiting.  Patient reports that his symptoms began 4 days ago.  He reports that his symptoms began with feeling chills and body aches.  He reports he has a posterior headache with no associated vision changes.  He denies neck stiffness, tenderness, or rash.  He does report a sore throat that is made swallowing painful.  Reports cough is nonproductive.  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  He denies any pain around his ears or fluctuance around the site of the cochlear implant on the right.  He reports he has been taking over-the-counter TheraFlu, Dramamine, and Advil for his symptoms without full relief.  Patient reports he lives in the home and does not work currently.  He does report that his friend may have been ill with COVID-19.  [employer] ASL interpreter, Trula OreChristina was used with a combination of ASL and lipreading of the interpreter.  This was per patient's preferred mode of communication.  Past Medical History:  Diagnosis Date  . Deafness     Patient Active Problem List   Diagnosis Date Noted  . Allergic rhinitis due to pollen 06/12/2015    Past Surgical History:  Procedure Laterality Date  . COCHLEAR IMPLANT  2004        Home Medications    Prior to Admission medications   Medication Sig Start Date End Date Taking? Authorizing Provider  amoxicillin (AMOXIL) 500 MG tablet Take 2 tablets (1,000 mg total) by mouth 2 (two) times daily. Patient not taking: Reported on 09/13/2016 09/04/15   Joselyn ArrowKnapp, Eve,  MD  benzonatate (TESSALON) 200 MG capsule Take 1 capsule (200 mg total) by mouth 3 (three) times daily as needed. Patient not taking: Reported on 09/13/2016 09/04/15   Joselyn ArrowKnapp, Eve, MD  guaiFENesin (MUCINEX) 600 MG 12 hr tablet Take 600 mg by mouth 2 (two) times daily.    [provider]  loratadine (CLARITIN) 10 MG tablet Take 10 mg by mouth daily.    [provider]  methocarbamol (ROBAXIN) 500 MG tablet Take 1-2 tablets (500-1,000 mg total) by mouth every 6 (six) hours as needed for muscle spasms. 09/04/15   Joselyn ArrowKnapp, Eve, MD  naproxen (NAPROSYN) 500 MG tablet Take 1 tablet (500 mg total) by mouth 2 (two) times daily with a meal. Patient not taking: Reported on 09/13/2016 09/04/15   Joselyn ArrowKnapp, Eve, MD  predniSONE (STERAPRED UNI-PAK 21 TAB) 10 MG (21) TBPK tablet Take by mouth daily. Take 6 tabs by mouth daily  for 2 days, then 5 tabs for 2 days, then 4 tabs for 2 days, then 3 tabs for 2 days, 2 tabs for 2 days, then 1 tab by mouth daily for 2 days 09/19/16   Long, Arlyss RepressJoshua G, MD    Family History History reviewed. No pertinent family history.  Social History Social History   Tobacco Use  . Smoking status: Former Smoker    Last attempt to quit: 04/15/2009    Years since quitting: 9.4  . Smokeless tobacco: Never Used  Substance  Use Topics  . Alcohol use: Yes    Alcohol/week: 0.0 standard drinks    Comment: socially 1-2 drinks once a month maybe (weekends)  . Drug use: No     Allergies   Patient has no known allergies.   Review of Systems Review of Systems  Constitutional: Positive for appetite change, chills, diaphoresis and fever.  HENT: Positive for congestion and rhinorrhea.   Respiratory: Positive for cough and shortness of breath.   Cardiovascular: Negative for leg swelling.  Gastrointestinal: Positive for nausea. Negative for abdominal pain and vomiting.  Musculoskeletal: Positive for myalgias.  Skin: Negative for rash.  Neurological: Positive for weakness.  All other  systems reviewed and are negative.    Physical Exam Updated Vital Signs BP 118/80 (BP Location: Left Arm)   Pulse 95   Temp (!) 101.6 F (38.7 C) (Oral)   Resp 18   Ht 5\' 5"  (1.651 m)   Wt 83.9 kg   SpO2 98%   BMI 30.79 kg/m   Physical Exam Vitals signs and nursing note reviewed.  Constitutional:      General: He is not in acute distress.    Appearance: He is well-developed. He is diaphoretic.  HENT:     Head: Normocephalic and atraumatic.     Right Ear: Tympanic membrane normal.     Left Ear: Tympanic membrane normal.     Ears:     Comments: Patient has a right cochlear implant in place.  There is no tenderness to palpation of tragus, pinna or mastoid.     Mouth/Throat:     Mouth: Mucous membranes are moist.     Comments: Mild erythema of posterior pharynx.  Eyes:     Conjunctiva/sclera: Conjunctivae normal.     Pupils: Pupils are equal, round, and reactive to light.  Neck:     Musculoskeletal: Normal range of motion and neck supple.  Cardiovascular:     Rate and Rhythm: Normal rate and regular rhythm.     Heart sounds: S1 normal and S2 normal. No murmur.  Pulmonary:     Breath sounds: Rhonchi and rales present. No wheezing.     Comments: Patient tachypneic but able to speak in full sentences. Coarse lung sounds in bases. Rhonchi present that do clear with cough. Abdominal:     General: There is no distension.     Palpations: Abdomen is soft.     Tenderness: There is no abdominal tenderness. There is no guarding.  Musculoskeletal: Normal range of motion.        General: No deformity.     Right lower leg: No edema.     Left lower leg: No edema.  Lymphadenopathy:     Cervical: No cervical adenopathy.  Skin:    General: Skin is warm.     Capillary Refill: Capillary refill takes less than 2 seconds.     Findings: No erythema or rash.     Comments: Skin of legs, arms, abdomen, back and face assessed and demonstrate evidence of rash.  Neurological:     Mental  Status: He is alert.     Comments: Cranial nerves grossly intact. Patient moves extremities symmetrically and with good coordination.  Psychiatric:        Behavior: Behavior normal.        Thought Content: Thought content normal.        Judgment: Judgment normal.      ED Treatments / Results  Labs (all labs ordered are listed, but only abnormal results  are displayed) Labs Reviewed  COMPREHENSIVE METABOLIC PANEL - Abnormal; Notable for the following components:      Result Value   Sodium 129 (*)    CO2 20 (*)    Glucose, Bld 110 (*)    Creatinine, Ser 1.38 (*)    Calcium 8.2 (*)    AST 55 (*)    ALT 57 (*)    All other components within normal limits  LACTIC ACID, PLASMA - Abnormal; Notable for the following components:   Lactic Acid, Venous 2.2 (*)    All other components within normal limits  GROUP A STREP BY PCR  SARS CORONAVIRUS 2 (HOSPITAL ORDER, PERFORMED IN [facility])  CBC WITH DIFFERENTIAL/PLATELET  LACTIC ACID, PLASMA  URINALYSIS, ROUTINE W REFLEX MICROSCOPIC    EKG EKG Interpretation  Date/Time:  Sunday Sep 06 2018 19:55:12 EDT Ventricular Rate:  90 PR Interval:    QRS Duration: 92 QT Interval:  333 QTC Calculation: 408 R Axis:   109 Text Interpretation:  Sinus rhythm Probable right ventricular hypertrophy No significant change since last tracing Confirmed by Melene Plan [phone]) on 09/06/2018 9:16:30 PM   Radiology Dg Chest Portable 1 View  Result Date: 09/06/2018 CLINICAL DATA:  Cough and fever EXAM: PORTABLE CHEST 1 VIEW COMPARISON:  Chest x-ray dated 01/15/2008 FINDINGS: Low lung volumes. No pneumothorax. No significant pleural effusion. The heart size is unremarkable for the low lung volumes. There is no acute osseous abnormality detected. IMPRESSION: Low lung volumes, otherwise unremarkable chest x-ray. Electronically Signed   By: Katherine Mantle M.D.   On: 09/06/2018 19:02    Procedures Procedures (including critical care time)   CRITICAL CARE Performed by: Elisha Ponder   Total critical care time: 35 minutes  Critical care time was exclusive of separately billable procedures and treating other patients.  Critical care was necessary to treat or prevent imminent or life-threatening deterioration.  Critical care was time spent personally by me on the following activities: development of treatment plan with patient and/or surrogate as well as nursing, discussions with consultants, evaluation of patient's response to treatment, examination of patient, obtaining history from patient or surrogate, ordering and performing treatments and interventions, ordering and review of laboratory studies, ordering and review of radiographic studies, pulse oximetry and re-evaluation of patient's condition.   Medications Ordered in ED Medications  albuterol (VENTOLIN HFA) 108 (90 Base) MCG/ACT inhaler 2 puff (has no administration in time range)  acetaminophen (TYLENOL) tablet 650 mg (has no administration in time range)  sodium chloride 0.9 % bolus 1,000 mL (1,000 mLs Intravenous New Bag/Given 09/06/18 1739)     Initial Impression / Assessment and Plan / ED Course  I have reviewed the triage vital signs and the nursing notes.  Pertinent labs & imaging results that were available during my care of the patient were reviewed by me and considered in my medical decision making (see chart for details).  Clinical Course as of Sep 06 2107  Sun Sep 06, 2018  1831 Pt receiving fluids.   Lactic Acid, Venous(!!): 2.2 [AM]  1859 Likely 2/2 volume depletion. Patient receiving fluids. Will need admission for volume depletion in setting of illness.   Sodium(!): 129 [AM]  1951 Group A Strep by PCR: NOT DETECTED [AM]  1951 Improving with fluid resuscitation and antipyretics.   Pulse Rate: 94 [AM]  2106 Spoke with Dr. Antionette Char who will admit patient. Appreciate his involvement.    [AM]    Clinical Course User Index [AM] [place] Scrape,  Doye Montilla B, PA-C        This is a previously healthy 28 year old male with a history of right cochlear implant 2004 presenting for cough, shortness of breath, sore throat, fever and myalgias.  He has been tachypneic during emergency department course.  No hypoxia.  Tachycardia did resolve with fluids.  Vitals:   09/06/18 1945 09/06/18 2000 09/06/18 2033 09/06/18 2042  BP: 104/68  124/70   Pulse: 94 90 94 98  Resp:  (!) 44 (!) 31 (!) 36  Temp:    (!) 101.7 F (38.7 C)  TempSrc:    Oral  SpO2: 99% 97% 97% 98%  Weight:      Height:         Broad spectrum antibiotics initiated b based on presented pneumonia given exam with rales.  High clinical suspicion for COVID-19, given exposure to individuals with recent febrile illness.  Work-up demonstrating no leukocytosis.  He does have a hyponatremia of 129, CO2 of 20, and AKI with creatinine of 1.38.  Suspect dehydration in the setting of viral or bacterial illness.  Lactic acidosis of 2.2.  Chest x-ray showed low lung volumes, but no acute infiltrate.  SARS-CoV-2 is positive.  Based on patient's clinical status with tachypnea, hyponatremia, AKI, and concern for decompensating respiratory status, will admit to [facility].  This case was discussed in detail with attending physician, Dr. Kennis Carina.   Sepsis - Repeat Assessment  Performed at:    8:48 PM   Vitals     Blood pressure 124/70, pulse 98, temperature (!) 101.7 F (38.7 C), temperature source Oral, resp. rate (!) 36, height  (1.651 m), weight 83.9 kg, SpO2 98 %.  Heart:     Tachycardic  Lungs:    Rales  Capillary Refill:   <2 sec  Peripheral Pulse:   Dorsalis pedis pulse  palpable  Skin:     Normal Color   Spoke with hospitalist Dr. Antionette Char who has decided to admit patient to a high level of care.    Final Clinical Impressions(s) / ED Diagnoses   Final diagnoses:  COVID-19  AKI (acute kidney injury) (HCC)  Hyponatremia  Generalized weakness  Fever in adult    ED  Discharge Orders    None       Delia Chimes 09/06/18 2135    Sabas Sous, MD 09/07/18 1335

## 2018-09-06 NOTE — ED Notes (Signed)
ED TO INPATIENT HANDOFF REPORT  ED Nurse Name and Phone #: Welda Azzarello  S Name/Age/Gender Antonio Li 28 y.o. male Room/Bed: 011C/011C  Code Status   Code Status: Full Code  Home/SNF/Other Home Patient oriented to: self, place, time and situation Is this baseline? Yes   Triage Complete: Triage complete  Chief Complaint cough  Triage Note Patient presents with complaints of weakness , difficulty swallowing, sore throat, cough and chest pain since Wednesday. Denies sick contacts at home. Reports episodes of vomiting Wednesday and Thursday. Reports waking up sweating and not being able to sleep.   Also reports headache.   Allergies No Known Allergies  Level of Care/Admitting Diagnosis ED Disposition    ED Disposition Condition Comment   Admit  Hospital Area: [facility] [[id number]]  Level of Care: Progressive [102]  Covid Evaluation: Confirmed COVID Positive  Isolation Risk Level: Low Risk/Droplet (Less than 4L Klemme supplementation)  Diagnosis: Acute respiratory disease due to COVID-19 virus [[id number]]  Admitting Physician: Briscoe Deutscher [[id number]]  Attending Physician: Briscoe Deutscher [[id number]]  Estimated length of stay: past midnight tomorrow  Certification:: I certify this patient will need inpatient services for at least 2 midnights  PT Class (Do Not Modify): Inpatient [101]  PT Acc Code (Do Not Modify): Private [1]       B Medical/Surgery History Past Medical History:  Diagnosis Date  . Deafness    Past Surgical History:  Procedure Laterality Date  . COCHLEAR IMPLANT  2004     A IV Location/Drains/Wounds Patient Lines/Drains/Airways Status   Active Line/Drains/Airways    Name:   Placement date:   Placement time:   Site:   Days:   Peripheral IV 09/06/18 Right Hand   09/06/18    1737    Hand   less than 1   Peripheral IV 09/06/18 Left Hand   09/06/18    2032    Hand   less than 1          Intake/Output Last 24 hours  Intake/Output  Summary (Last 24 hours) at 09/06/2018 2132 Last data filed at 09/06/2018 2131 Gross per 24 hour  Intake 350 ml  Output -  Net 350 ml    Labs/Imaging Results for orders placed or performed during the hospital encounter of 09/06/18 (from the past 48 hour(s))  Group A Strep by PCR     Status: None   Collection Time: 09/06/18  5:17 PM  Result Value Ref Range   Group A Strep by PCR NOT DETECTED NOT DETECTED    Comment: Performed at [facility], [address] [place], [place] [address]  Comprehensive metabolic panel     Status: Abnormal   Collection Time: 09/06/18  5:40 PM  Result Value Ref Range   Sodium 129 (L) 135 - 145 mmol/L   Potassium 4.4 3.5 - 5.1 mmol/L   Chloride 98 98 - 111 mmol/L   CO2 20 (L) 22 - 32 mmol/L   Glucose, Bld 110 (H) 70 - 99 mg/dL   BUN 13 6 - 20 mg/dL   Creatinine, Ser 2.59 (H) 0.61 - 1.24 mg/dL   Calcium 8.2 (L) 8.9 - 10.3 mg/dL   Total Protein 7.4 6.5 - 8.1 g/dL   Albumin 3.6 3.5 - 5.0 g/dL   AST 55 (H) 15 - 41 U/L   ALT 57 (H) 0 - 44 U/L   Alkaline Phosphatase 78 38 - 126 U/L   Total Bilirubin 0.9 0.3 - 1.2 mg/dL  GFR calc non Af Amer >60 >60 mL/min   GFR calc Af Amer >60 >60 mL/min   Anion gap 11 5 - 15    Comment: Performed at [facility], [address], [place], [place] [address]  CBC with Differential     Status: None   Collection Time: 09/06/18  5:40 PM  Result Value Ref Range   WBC 4.4 4.0 - 10.5 K/uL   RBC 5.53 4.22 - 5.81 MIL/uL   Hemoglobin 15.3 13.0 - 17.0 g/dL   HCT 91.4 78.2 - 95.6 %   MCV 83.5 80.0 - 100.0 fL   MCH 27.7 26.0 - 34.0 pg   MCHC 33.1 30.0 - 36.0 g/dL   RDW 21.3 08.6 - 57.8 %   Platelets 193 150 - 400 K/uL   nRBC 0.0 0.0 - 0.2 %   Neutrophils Relative % 58 %   Neutro Abs 2.6 1.7 - 7.7 K/uL   Lymphocytes Relative 29 %   Lymphs Abs 1.3 0.7 - 4.0 K/uL   Monocytes Relative 12 %   Monocytes Absolute 0.5 0.1 - 1.0 K/uL   Eosinophils Relative 0 %   Eosinophils Absolute 0.0 0.0 - 0.5 K/uL   Basophils  Relative 0 %   Basophils Absolute 0.0 0.0 - 0.1 K/uL   Immature Granulocytes 1 %   Abs Immature Granulocytes 0.02 0.00 - 0.07 K/uL    Comment: Performed at [facility], [address] [place], [place] [address]  SARS Coronavirus 2 [facility] order, Performed in [facility] hospital lab)     Status: Abnormal   Collection Time: 09/06/18  5:41 PM  Result Value Ref Range   SARS Coronavirus 2 POSITIVE (A) NEGATIVE    Comment: RESULT CALLED TO, READ BACK BY AND VERIFIED WITH: RN A Lowella Kindley [phone] MLM (NOTE) If result is NEGATIVE SARS-CoV-2 target nucleic acids are NOT DETECTED. The SARS-CoV-2 RNA is generally detectable in upper and lower  respiratory specimens during the acute phase of infection. The lowest  concentration of SARS-CoV-2 viral copies this assay can detect is 250  copies / mL. A negative result does not preclude SARS-CoV-2 infection  and should not be used as the sole basis for treatment or other  patient management decisions.  A negative result may occur with  improper specimen collection / handling, submission of specimen other  than nasopharyngeal swab, presence of viral mutation(s) within the  areas targeted by this assay, and inadequate number of viral copies  (<250 copies / mL). A negative result must be combined with clinical  observations, patient history, and epidemiological information. If result is POSITIVE SARS-CoV-2 target nucleic acids are DETECTED. The SARS- CoV-2 RNA is generally detectable in upper and lower  respiratory specimens during the acute phase of infection.  Positive  results are indicative of active infection with SARS-CoV-2.  Clinical  correlation with patient history and other diagnostic information is  necessary to determine patient infection status.  Positive results do  not rule out bacterial infection or co-infection with other viruses. If result is PRESUMPTIVE POSTIVE SARS-CoV-2 nucleic acids MAY BE PRESENT.   A presumptive  positive result was obtained on the submitted specimen  and confirmed on repeat testing.  While 2019 novel coronavirus  (SARS-CoV-2) nucleic acids may be present in the submitted sample  additional confirmatory testing may be necessary for epidemiological  and / or clinical management purposes  to differentiate between  SARS-CoV-2 and other Sarbecovirus currently known to infect humans.  If  clinically indicated additional testing with an alternate test  methodology [phone]) is advised . The SARS-CoV-2 RNA is generally  detectable in upper and lower respiratory specimens during the acute  phase of infection. The expected result is Negative. Fact Sheet for Patients:  BoilerBrush.com.cy Fact Sheet for Healthcare Providers: https://pope.com/ This test is not yet approved or cleared by the [place] FDA and has been authorized for detection and/or diagnosis of SARS-CoV-2 by FDA under an Emergency Use Authorization (EUA).  This EUA will remain in effect (meaning this test can be used) for the duration of the COVID-19 declaration under Section 564(b)(1) of the Act, 21 U.S.C. section 360bbb-3(b)(1), unless the authorization is terminated or revoked sooner. Performed at [facility], [address] [place], [place] [address]   Lactic acid, plasma     Status: Abnormal   Collection Time: 09/06/18  5:42 PM  Result Value Ref Range   Lactic Acid, Venous 2.2 (HH) 0.5 - 1.9 mmol/L    Comment: CRITICAL RESULT CALLED TO, READ BACK BY AND VERIFIED WITH: Jacqualin Combes RN AT 7616 09/06/2018 BY [facility] Performed at [facility], [address] [place], [place] [address]   Brain natriuretic peptide     Status: None   Collection Time: 09/06/18  8:12 PM  Result Value Ref Range   B Natriuretic Peptide 4.5 0.0 - 100.0 pg/mL    Comment: Performed at [facility], [address] [place] [address], [place], [place] [address]  Triglycerides     Status:  None   Collection Time: 09/06/18  8:25 PM  Result Value Ref Range   Triglycerides 92 <150 mg/dL    Comment: Performed at [facility], [address], [place], [place] [address]  C-reactive protein     Status: None   Collection Time: 09/06/18  8:25 PM  Result Value Ref Range   CRP 0.8 <1.0 mg/dL    Comment: Performed at [facility], [address], [place], [place] [address]   Dg Chest Portable 1 View  Result Date: 09/06/2018 CLINICAL DATA:  Cough and fever EXAM: PORTABLE CHEST 1 VIEW COMPARISON:  Chest x-ray dated 01/15/2008 FINDINGS: Low lung volumes. No pneumothorax. No significant pleural effusion. The heart size is unremarkable for the low lung volumes. There is no acute osseous abnormality detected. IMPRESSION: Low lung volumes, otherwise unremarkable chest x-ray. Electronically Signed   By: Katherine Mantle M.D.   On: 09/06/2018 19:02    Pending Labs Unresulted Labs (From admission, onward)    Start     Ordered   09/13/18 0500  Creatinine, serum  (enoxaparin (LOVENOX)    CrCl >/= 30 ml/min)  Weekly,   R    Comments:  while on enoxaparin therapy    09/06/18 2112   09/07/18 0500  CBC with Differential/Platelet  Daily,   R     09/06/18 2112   09/07/18 0500  Comprehensive metabolic panel  Daily,   R     09/06/18 2112   09/07/18 0500  C-reactive protein  Daily,   R     09/06/18 2112   09/07/18 0500  D-dimer, quantitative (not at [facility])  Daily,   R     09/06/18 2112   09/07/18 0500  Interleukin-6, Plasma  Daily,   R     09/06/18 2112   09/06/18 2117  Sodium, urine, random  Add-on,   R     09/06/18 2116   09/06/18 2117  Creatinine, urine, random  Add-on,  R     09/06/18 2116   09/06/18 2117  Osmolality, urine  Add-on,   R     09/06/18 2116   09/06/18 2114  Lactic acid, plasma  Once,   R     09/06/18 2113   09/06/18 2111  HIV antibody (Routine Testing)  Once,   R     09/06/18 2112   09/06/18 2059  D-dimer, quantitative (not at [facility])  ONCE - STAT,   R      09/06/18 2059   09/06/18 2059  Fibrinogen  ONCE - STAT,   STAT     09/06/18 2059   09/06/18 2005  Procalcitonin  ONCE - STAT,   STAT     09/06/18 2005   09/06/18 2005  Lactate dehydrogenase  Once,   STAT     09/06/18 2005   09/06/18 2005  Ferritin  Once,   STAT     09/06/18 2005   09/06/18 2005  Troponin I - Once  Once,   STAT     09/06/18 2005   09/06/18 1920  Blood Culture (routine x 2)  BLOOD CULTURE X 2,   STAT     09/06/18 1920   09/06/18 1736  Urinalysis, Routine w reflex microscopic  ONCE - STAT,   STAT     09/06/18 1735          Vitals/Pain Today's Vitals   09/06/18 2033 09/06/18 2042 09/06/18 2057 09/06/18 2130  BP: 124/70   105/72  Pulse: 94 98 (!) 102 98  Resp: (!) 31 (!) 36 (!) 41 (!) 40  Temp:  (!) 101.7 F (38.7 C)    TempSrc:  Oral    SpO2: 97% 98% 99% 97%  Weight:      Height:      PainSc:        Isolation Precautions Droplet and Contact precautions  Medications Medications  enoxaparin (LOVENOX) injection 40 mg (has no administration in time range)  sodium chloride flush (NS) 0.9 % injection 3 mL (has no administration in time range)  sodium chloride flush (NS) 0.9 % injection 3 mL (has no administration in time range)  sodium chloride flush (NS) 0.9 % injection 3 mL (has no administration in time range)  0.9 %  sodium chloride infusion (has no administration in time range)  acetaminophen (TYLENOL) tablet 650 mg (has no administration in time range)  HYDROcodone-acetaminophen (NORCO/VICODIN) 5-325 MG per tablet 1-2 tablet (has no administration in time range)  polyethylene glycol (MIRALAX / GLYCOLAX) packet 17 g (has no administration in time range)  ondansetron (ZOFRAN) tablet 4 mg (has no administration in time range)    Or  ondansetron (ZOFRAN) injection 4 mg (has no administration in time range)  sodium chloride 0.9 % bolus 1,000 mL (0 mLs Intravenous Stopped 09/06/18 2028)  albuterol (VENTOLIN HFA) 108 (90 Base) MCG/ACT inhaler 2 puff (2 puffs  Inhalation Given 09/06/18 2029)  acetaminophen (TYLENOL) tablet 650 mg (650 mg Oral Given 09/06/18 2029)    Mobility walks Low fall risk   Focused Assessments Pulmonary Assessment Handoff:  Lung sounds:   O2 Device: Room Air        R Recommendations: See Admitting Provider Note  Report given to:   Additional Notes: covid +, pt uses ASL interpretor, can read lips and communicate through writing.

## 2018-09-06 NOTE — ED Notes (Signed)
Nurse drawing labs. 

## 2018-09-07 ENCOUNTER — Encounter (HOSPITAL_COMMUNITY): Payer: Self-pay

## 2018-09-07 ENCOUNTER — Other Ambulatory Visit: Payer: Self-pay

## 2018-09-07 DIAGNOSIS — N179 Acute kidney failure, unspecified: Secondary | ICD-10-CM

## 2018-09-07 DIAGNOSIS — J988 Other specified respiratory disorders: Secondary | ICD-10-CM

## 2018-09-07 LAB — COMPREHENSIVE METABOLIC PANEL
ALT: 45 U/L — ABNORMAL HIGH (ref 0–44)
AST: 33 U/L (ref 15–41)
Albumin: 3.8 g/dL (ref 3.5–5.0)
Alkaline Phosphatase: 85 U/L (ref 38–126)
Anion gap: 10 (ref 5–15)
BUN: 11 mg/dL (ref 6–20)
CO2: 24 mmol/L (ref 22–32)
Calcium: 8.5 mg/dL — ABNORMAL LOW (ref 8.9–10.3)
Chloride: 102 mmol/L (ref 98–111)
Creatinine, Ser: 1.24 mg/dL (ref 0.61–1.24)
GFR calc Af Amer: >60 mL/min (ref >60)
GFR calc non Af Amer: >60 mL/min (ref >60)
Glucose, Bld: 127 mg/dL — ABNORMAL HIGH (ref 70–99)
Potassium: 3.6 mmol/L (ref 3.5–5.1)
Sodium: 136 mmol/L (ref 135–145)
Total Bilirubin: 0.2 mg/dL — ABNORMAL LOW (ref 0.3–1.2)
Total Protein: 8 g/dL (ref 6.5–8.1)

## 2018-09-07 LAB — CREATININE, URINE, RANDOM: Creatinine, Urine: 82.76 mg/dL

## 2018-09-07 LAB — SODIUM, URINE, RANDOM: Sodium, Ur: 32 mmol/L

## 2018-09-07 MED ORDER — MENTHOL 3 MG MT LOZG
1.0000 | LOZENGE | OROMUCOSAL | Status: DC | PRN
  Filled 2018-09-07: qty 9

## 2018-09-07 MED ORDER — PHENOL 1.4 % MT LIQD
1.0000 | OROMUCOSAL | Status: DC | PRN
  Filled 2018-09-07: qty 177

## 2018-09-07 MED ORDER — LIDOCAINE VISCOUS HCL 2 % MT SOLN
15.0000 mL | OROMUCOSAL | Status: DC | PRN
  Administered 2018-09-07 – 2018-09-11 (×12): 15 mL via OROMUCOSAL
  Filled 2018-09-07 (×17): qty 15

## 2018-09-07 NOTE — Progress Notes (Signed)
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  Diagnosed with COVID-19 infection.  Chest x-ray did not show any obvious pneumonia.  He is noted to be dehydrated with elevated creatinine and hyponatremia.  Admitted for further management at [facility].   Assessment & Plan:   Principal Problem:   Acute respiratory disease due to COVID-19 virus Active Problems:   Hyponatremia   Mild renal insufficiency   1. Acute respiratory distress secondary to COVID-19.  Continue to monitor for recurrence of fever.  Continue supportive management.  Overall respiratory status appears to be improving.  Encouraged to participate in incentive spirometry, from breathing and early ambulation.  Follow inflammatory markers.  If continues to improve, anticipate discharge in the next 24 hours. 2. Mild renal insufficiency.  Recheck labs today.  Continue on hydration. 3. Hyponatremia.  Likely related to volume depletion.  Recheck.   DVT prophylaxis: Lovenox Code Status: Full code Family Communication: No family Disposition Plan: Discharge home once improved   Consultants:     Procedures:     Antimicrobials:       Subjective: Patient is complaining of a sore throat.  He overall feels that his breathing is doing a little better today.  Has not ambulated today.  Objective: Vitals:   09/07/18 0954 09/07/18 1000 09/07/18 1100 09/07/18 1200  BP:  112/70 101/71 111/75  Pulse: 90 92 89 97  Resp: (!) 26 (!) 33 (!) 25 (!) 29  Temp:      TempSrc:      SpO2: 98% 98% 97% 93%  Weight:      Height:        Intake/Output Summary (Last 24 hours) at 09/07/2018 1228 Last data filed at 09/07/2018 1001 Gross per 24 hour  Intake 593 ml   Output 1150 ml  Net -557 ml   Filed Weights   09/06/18 1635 09/07/18 0400  Weight: 83.9 kg 84 kg    Examination:  General exam: Appears calm and comfortable  Respiratory system: Clear to auscultation. Respiratory effort normal. Cardiovascular system: S1 & S2 heard, RRR. No JVD, murmurs, rubs, gallops or clicks. No pedal edema. Gastrointestinal system: Abdomen is nondistended, soft and nontender. No organomegaly or masses felt. Normal bowel sounds heard. Central nervous system: Alert and oriented. No focal neurological deficits. Extremities: Symmetric 5 x 5 power. Skin: No rashes, lesions or ulcers Psychiatry: Judgement and insight appear normal. Mood & affect appropriate.     Data Reviewed: I have personally reviewed following labs and imaging studies  CBC: Recent Labs  Lab 09/06/18 1740  WBC 4.4  NEUTROABS 2.6  HGB 15.3  HCT 46.2  MCV 83.5  PLT 193   Basic Metabolic Panel: Recent Labs  Lab 09/06/18 1740  NA 129*  K 4.4  CL 98  CO2 20*  GLUCOSE 110*  BUN 13  CREATININE 1.38*  CALCIUM 8.2*   GFR: Estimated Creatinine Clearance: 78 mL/min (A) (by C-G formula based on SCr of 1.38 mg/dL (H)). Liver Function Tests: Recent Labs  Lab 09/06/18 1740  AST 55*  ALT 57*  ALKPHOS 78  BILITOT 0.9  PROT 7.4  ALBUMIN 3.6   No results for input(s): LIPASE, AMYLASE in the last 168 hours. No results for input(s): AMMONIA in the  last 168 hours. Coagulation Profile: No results for input(s): INR, PROTIME in the last 168 hours. Cardiac Enzymes: Recent Labs  Lab 09/06/18 2025  TROPONINI <0.03   BNP (last 3 results) No results for input(s): PROBNP in the last 8760 hours. HbA1C: No results for input(s): HGBA1C in the last 72 hours. CBG: No results for input(s): GLUCAP in the last 168 hours. Lipid Profile: Recent Labs    09/06/18 2025  TRIG 92   Thyroid Function Tests: No results for input(s): TSH, T4TOTAL, FREET4, T3FREE, THYROIDAB in the last 72 hours.  Anemia Panel: Recent Labs    09/06/18 2025  FERRITIN 229   Sepsis Labs: Recent Labs  Lab 09/06/18 1742 09/06/18 2013 09/06/18 2025  PROCALCITON  --   --  <0.10  LATICACIDVEN 2.2* 2.1*  --     Recent Results (from the past 240 hour(s))  Group A Strep by PCR     Status: None   Collection Time: 09/06/18  5:17 PM  Result Value Ref Range Status   Group A Strep by PCR NOT DETECTED NOT DETECTED Final    Comment: Performed at [facility], [address] [place], [place] [address]  SARS Coronavirus 2 [facility] order, Performed in [facility] hospital lab)     Status: Abnormal   Collection Time: 09/06/18  5:41 PM  Result Value Ref Range Status   SARS Coronavirus 2 POSITIVE (A) NEGATIVE Final    Comment: RESULT CALLED TO, READ BACK BY AND VERIFIED WITH: RN A DENNIS [id number] 2002 MLM (NOTE) If result is NEGATIVE SARS-CoV-2 target nucleic acids are NOT DETECTED. The SARS-CoV-2 RNA is generally detectable in upper and lower  respiratory specimens during the acute phase of infection. The lowest  concentration of SARS-CoV-2 viral copies this assay can detect is 250  copies / mL. A negative result does not preclude SARS-CoV-2 infection  and should not be used as the sole basis for treatment or other  patient management decisions.  A negative result may occur with  improper specimen collection / handling, submission of specimen other  than nasopharyngeal swab, presence of viral mutation(s) within the  areas targeted by this assay, and inadequate number of viral copies  (<250 copies / mL). A negative result must be combined with clinical  observations, patient history, and epidemiological information. If result is POSITIVE SARS-CoV-2 target nucleic acids are DETECTED. The SARS- CoV-2 RNA is generally detectable in upper and lower  respiratory specimens during the acute phase of infection.  Positive  results are indicative of active infection with SARS-CoV-2.  Clinical  correlation  with patient history and other diagnostic information is  necessary to determine patient infection status.  Positive results do  not rule out bacterial infection or co-infection with other viruses. If result is PRESUMPTIVE POSTIVE SARS-CoV-2 nucleic acids MAY BE PRESENT.   A presumptive positive result was obtained on the submitted specimen  and confirmed on repeat testing.  While 2019 novel coronavirus  (SARS-CoV-2) nucleic acids may be present in the submitted sample  additional confirmatory testing may be necessary for epidemiological  and / or clinical management purposes  to differentiate between  SARS-CoV-2 and other Sarbecovirus currently known to infect humans.  If clinically indicated additional testing with an alternate test  methodology [phone]) is advised . The SARS-CoV-2 RNA is generally  detectable in upper and lower respiratory specimens during the acute  phase of infection. The expected result is Negative. Fact Sheet for Patients:  BoilerBrush.com.cy Fact Sheet for Healthcare  Providers: https://pope.com/ This test is not yet approved or cleared by the [place] and has been authorized for detection and/or diagnosis of SARS-CoV-2 by FDA under an Emergency Use Authorization (EUA).  This EUA will remain in effect (meaning this test can be used) for the duration of the COVID-19 declaration under Section 564(b)(1) of the Act, 21 U.S.C. section 360bbb-3(b)(1), unless the authorization is terminated or revoked sooner. Performed at [facility], [address] [place], [place] [address]   Blood Culture (routine x 2)     Status: None (Preliminary result)   Collection Time: 09/06/18  8:12 PM  Result Value Ref Range Status   Specimen Description BLOOD LEFT HAND  Final   Special Requests   Final    BOTTLES DRAWN AEROBIC AND ANAEROBIC Blood Culture adequate volume   Culture   Final    NO GROWTH < 12 HOURS  Performed at [facility], [address] [place], [place] [address]    Report Status PENDING  Incomplete  Blood Culture (routine x 2)     Status: None (Preliminary result)   Collection Time: 09/06/18  8:23 PM  Result Value Ref Range Status   Specimen Description BLOOD LEFT FOREARM  Final   Special Requests   Final    BOTTLES DRAWN AEROBIC AND ANAEROBIC Blood Culture results may not be optimal due to an inadequate volume of blood received in culture bottles   Culture   Final    NO GROWTH < 12 HOURS Performed at [facility], [address] [place], [place] [address]    Report Status PENDING  Incomplete         Radiology Studies: Dg Chest Portable 1 View  Result Date: 09/06/2018 CLINICAL DATA:  Cough and fever EXAM: PORTABLE CHEST 1 VIEW COMPARISON:  Chest x-ray dated 01/15/2008 FINDINGS: Low lung volumes. No pneumothorax. No significant pleural effusion. The heart size is unremarkable for the low lung volumes. There is no acute osseous abnormality detected. IMPRESSION: Low lung volumes, otherwise unremarkable chest x-ray. Electronically Signed   By: Katherine Mantle M.D.   On: 09/06/2018 19:02        Scheduled Meds: . enoxaparin (LOVENOX) injection  40 mg Subcutaneous Q24H  . sodium chloride flush  3 mL Intravenous Q12H  . sodium chloride flush  3 mL Intravenous Q12H   Continuous Infusions: . sodium chloride       LOS: 1 day    Time spent:    Erick Blinks, MD [facility]   If 7PM-7AM, please contact night-coverage [email or web address]  09/07/2018, 12:28 PM

## 2018-09-08 LAB — CBC WITH DIFFERENTIAL/PLATELET
Abs Immature Granulocytes: 0.01 10*3/uL (ref 0.00–0.07)
Basophils Absolute: 0 10*3/uL (ref 0.0–0.1)
Basophils Relative: 0 %
Eosinophils Absolute: 0 10*3/uL (ref 0.0–0.5)
Eosinophils Relative: 0 %
HCT: 45.4 % (ref 39.0–52.0)
Hemoglobin: 15.2 g/dL (ref 13.0–17.0)
Immature Granulocytes: 0 %
Lymphocytes Relative: 30 %
Lymphs Abs: 1.5 10*3/uL (ref 0.7–4.0)
MCH: 27.9 pg (ref 26.0–34.0)
MCHC: 33.5 g/dL (ref 30.0–36.0)
MCV: 83.3 fL (ref 80.0–100.0)
Monocytes Absolute: 0.4 10*3/uL (ref 0.1–1.0)
Monocytes Relative: 8 %
Neutro Abs: 3.1 10*3/uL (ref 1.7–7.7)
Neutrophils Relative %: 62 %
Platelets: 176 10*3/uL (ref 150–400)
RBC: 5.45 MIL/uL (ref 4.22–5.81)
RDW: 12.8 % (ref 11.5–15.5)
WBC: 5 10*3/uL (ref 4.0–10.5)
nRBC: 0 % (ref 0.0–0.2)

## 2018-09-08 LAB — COMPREHENSIVE METABOLIC PANEL
ALT: 38 U/L (ref 0–44)
AST: 27 U/L (ref 15–41)
Albumin: 3.5 g/dL (ref 3.5–5.0)
Alkaline Phosphatase: 76 U/L (ref 38–126)
Anion gap: 9 (ref 5–15)
BUN: 10 mg/dL (ref 6–20)
CO2: 23 mmol/L (ref 22–32)
Calcium: 8.6 mg/dL — ABNORMAL LOW (ref 8.9–10.3)
Chloride: 103 mmol/L (ref 98–111)
Creatinine, Ser: 1.22 mg/dL (ref 0.61–1.24)
GFR calc Af Amer: >60 mL/min (ref >60)
GFR calc non Af Amer: >60 mL/min (ref >60)
Glucose, Bld: 98 mg/dL (ref 70–99)
Potassium: 3.3 mmol/L — ABNORMAL LOW (ref 3.5–5.1)
Sodium: 135 mmol/L (ref 135–145)
Total Bilirubin: 0.3 mg/dL (ref 0.3–1.2)
Total Protein: 7.5 g/dL (ref 6.5–8.1)

## 2018-09-08 LAB — C-REACTIVE PROTEIN: CRP: 1.6 mg/dL — ABNORMAL HIGH (ref <1.0)

## 2018-09-08 LAB — D-DIMER, QUANTITATIVE: D-Dimer, Quant: 0.38 ug/mL-FEU (ref 0.00–0.50)

## 2018-09-08 LAB — OSMOLALITY, URINE: Osmolality, Ur: 265 mOsm/kg — ABNORMAL LOW (ref 300–900)

## 2018-09-08 MED ORDER — POTASSIUM CHLORIDE CRYS ER 20 MEQ PO TBCR
40.0000 meq | EXTENDED_RELEASE_TABLET | Freq: Once | ORAL | Status: AC
  Administered 2018-09-08: 18:00:00 40 meq via ORAL
  Filled 2018-09-08: qty 2

## 2018-09-08 MED ORDER — SODIUM CHLORIDE 0.9 % IV SOLN
INTRAVENOUS | Status: AC
  Administered 2018-09-08: 18:00:00 via INTRAVENOUS

## 2018-09-08 MED ORDER — GUAIFENESIN-DM 100-10 MG/5ML PO SYRP
5.0000 mL | ORAL_SOLUTION | ORAL | Status: DC | PRN
  Administered 2018-09-08 – 2018-09-11 (×13): 5 mL via ORAL
  Filled 2018-09-08 (×11): qty 5

## 2018-09-08 NOTE — Progress Notes (Signed)
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  Diagnosed with COVID-19 infection.  Chest x-ray did not show any obvious pneumonia.  He is noted to be dehydrated with elevated creatinine and hyponatremia.  Admitted for further management at [facility].   Assessment & Plan:   Principal Problem:   Acute respiratory disease due to COVID-19 virus Active Problems:   Hyponatremia   Mild renal insufficiency   1. Acute respiratory distress secondary to COVID-19.  Continue to monitor for recurrence of fever.  Continue supportive management.  Overall respiratory status appears to be improving.  Encouraged to participate in incentive spirometry, from breathing and early ambulation.  Follow inflammatory markers.  We will try and ambulate patient today.  He is still having fevers.  He is mildly tachycardic and blood pressures are soft.  Will start on gentle hydration 2. Mild renal insufficiency.  Improved with hydration. 3. Hyponatremia.  Likely related to volume depletion.  Improved with hydration.   DVT prophylaxis: Lovenox Code Status: Full code Family Communication: No family Disposition Plan: Discharge home once improved   Consultants:     Procedures:     Antimicrobials:       Subjective: Patient is complaining of a sore throat.  He overall feels that his breathing is doing a little better today.  Has not ambulated today.  Objective: Vitals:   09/08/18 1051 09/08/18 1137 09/08/18 1400 09/08/18 1420  BP:      Pulse:   99 98  Resp:   (!) 27 (!) 21  Temp: 98.7 F (37.1 C) 99.4 F (37.4 C) 99.7 F (37.6 C) 99.4 F (37.4 C)  TempSrc: Oral  Oral   SpO2:   94% 97%  Weight:      Height:         Intake/Output Summary (Last 24 hours) at 09/08/2018 1604 Last data filed at 09/08/2018 1420 Gross per 24 hour  Intake 723 ml  Output 800 ml  Net -77 ml   Filed Weights   09/06/18 1635 09/07/18 0400  Weight: 83.9 kg 84 kg    Examination:  General exam: Alert, awake, oriented x 3, pharyngeal erythema is present Respiratory system: Clear to auscultation. Respiratory effort normal. Cardiovascular system:RRR. No murmurs, rubs, gallops. Gastrointestinal system: Abdomen is nondistended, soft and nontender. No organomegaly or masses felt. Normal bowel sounds heard. Central nervous system: Alert and oriented. No focal neurological deficits. Extremities: No C/C/E, +pedal pulses Skin: No rashes, lesions or ulcers Psychiatry: Judgement and insight appear normal. Mood & affect appropriate.       Data Reviewed: I have personally reviewed following labs and imaging studies  CBC: Recent Labs  Lab 09/06/18 1740 09/08/18 0334  WBC 4.4 5.0  NEUTROABS 2.6 3.1  HGB 15.3 15.2  HCT 46.2 45.4  MCV 83.5 83.3  PLT 193 176   Basic Metabolic Panel: Recent Labs  Lab 09/06/18 1740 09/07/18 1308 09/08/18 0334  NA 129* 136 135  K 4.4 3.6 3.3*  CL 98 102 103  CO2 20* 24 23  GLUCOSE 110* 127* 98  BUN CREATININE 1.38* 1.24 1.22  CALCIUM 8.2* 8.5* 8.6*   GFR: Estimated Creatinine Clearance: 88.3 mL/min (by C-G formula based on SCr of 1.22 mg/dL). Liver  Function Tests: Recent Labs  Lab 09/06/18 1740 09/07/18 1308 09/08/18 0334  AST 55* 33 27  ALT 57* 45* 38  ALKPHOS 78 85 76  BILITOT 0.9 0.2* 0.3  PROT 7.4 8.0 7.5  ALBUMIN 3.6 3.8 3.5   No results for input(s): LIPASE, AMYLASE in the last 168 hours. No results for input(s): AMMONIA in the last 168 hours. Coagulation Profile: No results for input(s): INR, PROTIME in the last 168 hours. Cardiac Enzymes: Recent Labs  Lab 09/06/18 2025  TROPONINI <0.03   BNP (last 3 results) No results for input(s): PROBNP in the  last 8760 hours. HbA1C: No results for input(s): HGBA1C in the last 72 hours. CBG: No results for input(s): GLUCAP in the last 168 hours. Lipid Profile: Recent Labs    09/06/18 2025  TRIG 92   Thyroid Function Tests: No results for input(s): TSH, T4TOTAL, FREET4, T3FREE, THYROIDAB in the last 72 hours. Anemia Panel: Recent Labs    09/06/18 2025  FERRITIN 229   Sepsis Labs: Recent Labs  Lab 09/06/18 1742 09/06/18 2013 09/06/18 2025  PROCALCITON  --   --  <0.10  LATICACIDVEN 2.2* 2.1*  --     Recent Results (from the past 240 hour(s))  Group A Strep by PCR     Status: None   Collection Time: 09/06/18  5:17 PM  Result Value Ref Range Status   Group A Strep by PCR NOT DETECTED NOT DETECTED Final    Comment: Performed at [facility], [address], [place], [place] [address]  SARS Coronavirus 2 [facility] order, Performed in [facility] hospital lab)     Status: Abnormal   Collection Time: 09/06/18  5:41 PM  Result Value Ref Range Status   SARS Coronavirus 2 POSITIVE (A) NEGATIVE Final    Comment: RESULT CALLED TO, READ BACK BY AND VERIFIED WITH: RN A DENNIS [id number] 2002 MLM (NOTE) If result is NEGATIVE SARS-CoV-2 target nucleic acids are NOT DETECTED. The SARS-CoV-2 RNA is generally detectable in upper and lower  respiratory specimens during the acute phase of infection. The lowest  concentration of SARS-CoV-2 viral copies this assay can detect is 250  copies / mL. A negative result does not preclude SARS-CoV-2 infection  and should not be used as the sole basis for treatment or other  patient management decisions.  A negative result may occur with  improper specimen collection / handling, submission of specimen other  than nasopharyngeal swab, presence of viral mutation(s) within the  areas targeted by this assay, and inadequate number of viral copies  (<250 copies / mL). A negative result must be combined with clinical  observations, patient history, and  epidemiological information. If result is POSITIVE SARS-CoV-2 target nucleic acids are DETECTED. The SARS- CoV-2 RNA is generally detectable in upper and lower  respiratory specimens during the acute phase of infection.  Positive  results are indicative of active infection with SARS-CoV-2.  Clinical  correlation with patient history and other diagnostic information is  necessary to determine patient infection status.  Positive results do  not rule out bacterial infection or co-infection with other viruses. If result is PRESUMPTIVE POSTIVE SARS-CoV-2 nucleic acids MAY BE PRESENT.   A presumptive positive result was obtained on the submitted specimen  and confirmed on repeat testing.  While 2019 novel coronavirus  (SARS-CoV-2) nucleic acids may be present in the submitted sample  additional confirmatory testing may be necessary for epidemiological  and / or clinical management purposes  to differentiate  between  SARS-CoV-2 and other Sarbecovirus currently known to infect humans.  If clinically indicated additional testing with an alternate test  methodology [phone]([id number]) is advised . The SARS-CoV-2 RNA is generally  detectable in upper and lower respiratory specimens during the acute  phase of infection. The expected result is Negative. Fact Sheet for Patients:  BoilerBrush.com.cyhttps://www.fda.gov/media/136312/download Fact Sheet for Healthcare Providers: https://pope.com/https://www.fda.gov/media/136313/download This test is not yet approved or cleared by the [place] FDA and has been authorized for detection and/or diagnosis of SARS-CoV-2 by FDA under an Emergency Use Authorization (EUA).  This EUA will remain in effect (meaning this test can be used) for the duration of the COVID-19 declaration under Section 564(b)(1) of the Act, 21 U.S.C. section 360bbb-3(b)(1), unless the authorization is terminated or revoked sooner. Performed at [facility], [address] [place], [place] [address]   Blood Culture  (routine x 2)     Status: None (Preliminary result)   Collection Time: 09/06/18  8:12 PM  Result Value Ref Range Status   Specimen Description BLOOD LEFT HAND  Final   Special Requests   Final    BOTTLES DRAWN AEROBIC AND ANAEROBIC Blood Culture adequate volume   Culture   Final    NO GROWTH 2 DAYS Performed at [facility], [address] [place], [place] [address]    Report Status PENDING  Incomplete  Blood Culture (routine x 2)     Status: None (Preliminary result)   Collection Time: 09/06/18  8:23 PM  Result Value Ref Range Status   Specimen Description BLOOD LEFT FOREARM  Final   Special Requests   Final    BOTTLES DRAWN AEROBIC AND ANAEROBIC Blood Culture results may not be optimal due to an inadequate volume of blood received in culture bottles   Culture   Final    NO GROWTH 2 DAYS Performed at [facility], [address] [place], [place] [address]    Report Status PENDING  Incomplete         Radiology Studies: Dg Chest Portable 1 View  Result Date: 09/06/2018 CLINICAL DATA:  Cough and fever EXAM: PORTABLE CHEST 1 VIEW COMPARISON:  Chest x-ray dated 01/15/2008 FINDINGS: Low lung volumes. No pneumothorax. No significant pleural effusion. The heart size is unremarkable for the low lung volumes. There is no acute osseous abnormality detected. IMPRESSION: Low lung volumes, otherwise unremarkable chest x-ray. Electronically Signed   By: Katherine Mantlehristopher  Green M.D.   On: 09/06/2018 19:02        Scheduled Meds:  enoxaparin (LOVENOX) injection  40 mg Subcutaneous Q24H   sodium chloride flush  3 mL Intravenous Q12H   sodium chloride flush  3 mL Intravenous Q12H   Continuous Infusions:  sodium chloride     sodium chloride       LOS: 2 days    Time spent: 35mins    Erick BlinksJehanzeb Makhia Vosler, MD [facility]   If 7PM-7AM, please contact night-coverage [email or web address]  09/08/2018, 4:04 PM

## 2018-09-09 ENCOUNTER — Inpatient Hospital Stay (HOSPITAL_COMMUNITY): Payer: Medicaid Other

## 2018-09-09 LAB — CBC WITH DIFFERENTIAL/PLATELET
Abs Immature Granulocytes: 0.02 10*3/uL (ref 0.00–0.07)
Basophils Absolute: 0 10*3/uL (ref 0.0–0.1)
Basophils Relative: 0 %
Eosinophils Absolute: 0 10*3/uL (ref 0.0–0.5)
Eosinophils Relative: 0 %
HCT: 42.9 % (ref 39.0–52.0)
Hemoglobin: 14.2 g/dL (ref 13.0–17.0)
Immature Granulocytes: 0 %
Lymphocytes Relative: 29 %
Lymphs Abs: 1.4 10*3/uL (ref 0.7–4.0)
MCH: 27.7 pg (ref 26.0–34.0)
MCHC: 33.1 g/dL (ref 30.0–36.0)
MCV: 83.6 fL (ref 80.0–100.0)
Monocytes Absolute: 0.3 10*3/uL (ref 0.1–1.0)
Monocytes Relative: 7 %
Neutro Abs: 2.9 10*3/uL (ref 1.7–7.7)
Neutrophils Relative %: 64 %
Platelets: 174 10*3/uL (ref 150–400)
RBC: 5.13 MIL/uL (ref 4.22–5.81)
RDW: 12.8 % (ref 11.5–15.5)
WBC: 4.6 10*3/uL (ref 4.0–10.5)
nRBC: 0 % (ref 0.0–0.2)

## 2018-09-09 LAB — COMPREHENSIVE METABOLIC PANEL
ALT: 31 U/L (ref 0–44)
AST: 28 U/L (ref 15–41)
Albumin: 3.5 g/dL (ref 3.5–5.0)
Alkaline Phosphatase: 67 U/L (ref 38–126)
Anion gap: 8 (ref 5–15)
BUN: 14 mg/dL (ref 6–20)
CO2: 21 mmol/L — ABNORMAL LOW (ref 22–32)
Calcium: 8.1 mg/dL — ABNORMAL LOW (ref 8.9–10.3)
Chloride: 105 mmol/L (ref 98–111)
Creatinine, Ser: 1.21 mg/dL (ref 0.61–1.24)
GFR calc Af Amer: >60 mL/min (ref >60)
GFR calc non Af Amer: >60 mL/min (ref >60)
Glucose, Bld: 92 mg/dL (ref 70–99)
Potassium: 3.9 mmol/L (ref 3.5–5.1)
Sodium: 134 mmol/L — ABNORMAL LOW (ref 135–145)
Total Bilirubin: 0.5 mg/dL (ref 0.3–1.2)
Total Protein: 7.4 g/dL (ref 6.5–8.1)

## 2018-09-09 LAB — GROUP A STREP BY PCR: Group A Strep by PCR: NOT DETECTED

## 2018-09-09 LAB — C-REACTIVE PROTEIN: CRP: 3.7 mg/dL — ABNORMAL HIGH (ref <1.0)

## 2018-09-09 LAB — D-DIMER, QUANTITATIVE: D-Dimer, Quant: 0.41 ug/mL-FEU (ref 0.00–0.50)

## 2018-09-09 MED ORDER — IOHEXOL 300 MG/ML  SOLN
75.0000 mL | Freq: Once | INTRAMUSCULAR | Status: AC | PRN
  Administered 2018-09-09: 18:00:00 75 mL via INTRAVENOUS

## 2018-09-09 NOTE — Progress Notes (Signed)
C/o sore throat pain as main symptom- pt would like to have all his nurses in capper as he can than read lips- I did provide paper and pen so notes can be written out- pt states he has spoken with family this evening

## 2018-09-09 NOTE — Progress Notes (Signed)
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  Diagnosed with COVID-19 infection.  Chest x-ray did not show any obvious pneumonia.  He is noted to be dehydrated with elevated creatinine and hyponatremia.  Admitted for further management at [facility].   Assessment & Plan:   Principal Problem:   Acute respiratory disease due to COVID-19 virus Active Problems:   Hyponatremia   Mild renal insufficiency   1. Acute respiratory distress secondary to COVID-19.  Overall respiratory status appears to have improved.  He is currently on room air.  Encouraged to participate in incentive spirometry, from breathing and early ambulation.  Inflammatory markers have started to trend up.  He continues to have high-grade fevers up to 103.  Main complaint is a sore throat and painful swallowing.  Will check rapid strep as well as CT of neck to rule out any underlying abscess. 2. Mild renal insufficiency.  Improved with hydration. 3. Hyponatremia.  Likely related to volume depletion.  Improved with hydration.   DVT prophylaxis: Lovenox Code Status: Full code Family Communication: No family Disposition Plan: Discharge home once improved   Consultants:     Procedures:     Antimicrobials:       Subjective: Continues to have fevers to almost 103.  Main complaint is still sore throat and difficulty swallowing  Objective: Vitals:   09/09/18 1100 09/09/18 1136 09/09/18 1200 09/09/18 1552  BP:  109/67  103/66  Pulse: 81 82 82 100  Resp: (!) 31 (!) 31 15 (!) 30  Temp:  (!) 97.4 F (36.3 C)  (!) 102.9 F (39.4 C)  TempSrc:  Oral  Oral  SpO2: 96% 96% 97% 97%  Weight:      Height:        Intake/Output Summary (Last 24  hours) at 09/09/2018 1717 Last data filed at 09/09/2018 1459 Gross per 24 hour  Intake 2536.92 ml  Output 2600 ml  Net -63.08 ml   Filed Weights   09/06/18 1635 09/07/18 0400  Weight: 83.9 kg 84 kg    Examination:  General exam: Alert, awake, oriented x 3, pharyngeal erythema is present without exudates Respiratory system: Clear to auscultation. Respiratory effort normal. Cardiovascular system:RRR. No murmurs, rubs, gallops. Gastrointestinal system: Abdomen is nondistended, soft and nontender. No organomegaly or masses felt. Normal bowel sounds heard. Central nervous system: Alert and oriented. No focal neurological deficits. Extremities: No C/C/E, +pedal pulses Skin: No rashes, lesions or ulcers Psychiatry: Judgement and insight appear normal. Mood & affect appropriate.      Data Reviewed: I have personally reviewed following labs and imaging studies  CBC: Recent Labs  Lab 09/06/18 1740 09/08/18 0334 09/09/18 0351  WBC 4.4 5.0 4.6  NEUTROABS 2.6 3.1 2.9  HGB 15.3 15.2 14.2  HCT 46.2 45.4 42.9  MCV 83.5 83.3 83.6  PLT 193 176 174   Basic Metabolic Panel: Recent Labs  Lab 09/06/18 1740 09/07/18 1308 09/08/18 0334 09/09/18 0351  NA 129* 136 135 134*  K 4.4 3.6 3.3* 3.9  CL 98 102 103 105  CO2 20* 24 23 21*  GLUCOSE 110* 127* 98 92  BUN CREATININE 1.38* 1.24 1.22 1.21  CALCIUM 8.2* 8.5* 8.6* 8.1*   GFR: Estimated  Creatinine Clearance: 89 mL/min (by C-G formula based on SCr of 1.21 mg/dL). Liver Function Tests: Recent Labs  Lab 09/06/18 1740 09/07/18 1308 09/08/18 0334 09/09/18 0351  AST 55* 33 27 28  ALT 57* 45* 38 31  ALKPHOS 78 85 76 67  BILITOT 0.9 0.2* 0.3 0.5  PROT 7.4 8.0 7.5 7.4  ALBUMIN 3.6 3.8 3.5 3.5   No results for input(s): LIPASE, AMYLASE in the last 168 hours. No results for input(s): AMMONIA in the last 168 hours. Coagulation Profile: No results for input(s): INR, PROTIME in the last 168 hours. Cardiac Enzymes:  Recent Labs  Lab 09/06/18 2025  TROPONINI <0.03   BNP (last 3 results) No results for input(s): PROBNP in the last 8760 hours. HbA1C: No results for input(s): HGBA1C in the last 72 hours. CBG: No results for input(s): GLUCAP in the last 168 hours. Lipid Profile: Recent Labs    09/06/18 2025  TRIG 92   Thyroid Function Tests: No results for input(s): TSH, T4TOTAL, FREET4, T3FREE, THYROIDAB in the last 72 hours. Anemia Panel: Recent Labs    09/06/18 2025  FERRITIN 229   Sepsis Labs: Recent Labs  Lab 09/06/18 1742 09/06/18 2013 09/06/18 2025  PROCALCITON  --   --  <0.10  LATICACIDVEN 2.2* 2.1*  --     Recent Results (from the past 240 hour(s))  Group A Strep by PCR     Status: None   Collection Time: 09/06/18  5:17 PM  Result Value Ref Range Status   Group A Strep by PCR NOT DETECTED NOT DETECTED Final    Comment: Performed at [facility], [address] [place], [place] [address]  SARS Coronavirus 2 [facility](Hospital order, Performed in [facility] hospital lab)     Status: Abnormal   Collection Time: 09/06/18  5:41 PM  Result Value Ref Range Status   SARS Coronavirus 2 POSITIVE (A) NEGATIVE Final    Comment: RESULT CALLED TO, READ BACK BY AND VERIFIED WITH: RN A DENNIS [id number] (NOTE) If result is NEGATIVE SARS-CoV-2 target nucleic acids are NOT DETECTED. The SARS-CoV-2 RNA is generally detectable in upper and lower  respiratory specimens during the acute phase of infection. The lowest  concentration of SARS-CoV-2 viral copies this assay can detect is 250  copies / mL. A negative result does not preclude SARS-CoV-2 infection  and should not be used as the sole basis for treatment or other  patient management decisions.  A negative result may occur with  improper specimen collection / handling, submission of specimen other  than nasopharyngeal swab, presence of viral mutation(s) within the  areas targeted by this assay, and inadequate number of viral  copies  (<250 copies / mL). A negative result must be combined with clinical  observations, patient history, and epidemiological information. If result is POSITIVE SARS-CoV-2 target nucleic acids are DETECTED. The SARS- CoV-2 RNA is generally detectable in upper and lower  respiratory specimens during the acute phase of infection.  Positive  results are indicative of active infection with SARS-CoV-2.  Clinical  correlation with patient history and other diagnostic information is  necessary to determine patient infection status.  Positive results do  not rule out bacterial infection or co-infection with other viruses. If result is PRESUMPTIVE POSTIVE SARS-CoV-2 nucleic acids MAY BE PRESENT.   A presumptive positive result was obtained on the submitted specimen  and confirmed on repeat testing.  While 2019 novel coronavirus  (SARS-CoV-2) nucleic acids may be present in  the submitted sample  additional confirmatory testing may be necessary for epidemiological  and / or clinical management purposes  to differentiate between  SARS-CoV-2 and other Sarbecovirus currently known to infect humans.  If clinically indicated additional testing with an alternate test  methodology [phone]) is advised . The SARS-CoV-2 RNA is generally  detectable in upper and lower respiratory specimens during the acute  phase of infection. The expected result is Negative. Fact Sheet for Patients:  BoilerBrush.com.cy Fact Sheet for Healthcare Providers: https://pope.com/ This test is not yet approved or cleared by the [place] FDA and has been authorized for detection and/or diagnosis of SARS-CoV-2 by FDA under an Emergency Use Authorization (EUA).  This EUA will remain in effect (meaning this test can be used) for the duration of the COVID-19 declaration under Section 564(b)(1) of the Act, 21 U.S.C. section 360bbb-3(b)(1), unless the authorization is terminated  or revoked sooner. Performed at [facility], [address] [place], [place] [address]   Blood Culture (routine x 2)     Status: None (Preliminary result)   Collection Time: 09/06/18  8:12 PM  Result Value Ref Range Status   Specimen Description BLOOD LEFT HAND  Final   Special Requests   Final    BOTTLES DRAWN AEROBIC AND ANAEROBIC Blood Culture adequate volume   Culture   Final    NO GROWTH 3 DAYS Performed at [facility], [address] [place], [place] [address]    Report Status PENDING  Incomplete  Blood Culture (routine x 2)     Status: None (Preliminary result)   Collection Time: 09/06/18  8:23 PM  Result Value Ref Range Status   Specimen Description BLOOD LEFT FOREARM  Final   Special Requests   Final    BOTTLES DRAWN AEROBIC AND ANAEROBIC Blood Culture results may not be optimal due to an inadequate volume of blood received in culture bottles   Culture   Final    NO GROWTH 3 DAYS Performed at [facility], [address], [place], [place] [address]    Report Status PENDING  Incomplete         Radiology Studies: No results found.      Scheduled Meds: . enoxaparin (LOVENOX) injection  40 mg Subcutaneous Q24H  . sodium chloride flush  3 mL Intravenous Q12H  . sodium chloride flush  3 mL Intravenous Q12H   Continuous Infusions: . sodium chloride       LOS: 3 days    Time spent:    Erick Blinks, MD [facility]   If 7PM-7AM, please contact night-coverage [email or web address]  09/09/2018, 5:17 PM

## 2018-09-10 ENCOUNTER — Inpatient Hospital Stay (HOSPITAL_COMMUNITY): Payer: Medicaid Other

## 2018-09-10 DIAGNOSIS — R509 Fever, unspecified: Secondary | ICD-10-CM

## 2018-09-10 LAB — COMPREHENSIVE METABOLIC PANEL
ALT: 35 U/L (ref 0–44)
AST: 36 U/L (ref 15–41)
Albumin: 3.5 g/dL (ref 3.5–5.0)
Alkaline Phosphatase: 77 U/L (ref 38–126)
Anion gap: 8 (ref 5–15)
BUN: 14 mg/dL (ref 6–20)
CO2: 22 mmol/L (ref 22–32)
Calcium: 8.4 mg/dL — ABNORMAL LOW (ref 8.9–10.3)
Chloride: 104 mmol/L (ref 98–111)
Creatinine, Ser: 1.29 mg/dL — ABNORMAL HIGH (ref 0.61–1.24)
GFR calc Af Amer: >60 mL/min (ref >60)
GFR calc non Af Amer: >60 mL/min (ref >60)
Glucose, Bld: 87 mg/dL (ref 70–99)
Potassium: 3.9 mmol/L (ref 3.5–5.1)
Sodium: 134 mmol/L — ABNORMAL LOW (ref 135–145)
Total Bilirubin: 0.4 mg/dL (ref 0.3–1.2)
Total Protein: 7.6 g/dL (ref 6.5–8.1)

## 2018-09-10 LAB — CBC WITH DIFFERENTIAL/PLATELET
Abs Immature Granulocytes: 0.02 10*3/uL (ref 0.00–0.07)
Basophils Absolute: 0 10*3/uL (ref 0.0–0.1)
Basophils Relative: 1 %
Eosinophils Absolute: 0 10*3/uL (ref 0.0–0.5)
Eosinophils Relative: 1 %
HCT: 43.8 % (ref 39.0–52.0)
Hemoglobin: 14.6 g/dL (ref 13.0–17.0)
Immature Granulocytes: 1 %
Lymphocytes Relative: 37 %
Lymphs Abs: 1.6 10*3/uL (ref 0.7–4.0)
MCH: 27.8 pg (ref 26.0–34.0)
MCHC: 33.3 g/dL (ref 30.0–36.0)
MCV: 83.4 fL (ref 80.0–100.0)
Monocytes Absolute: 0.3 10*3/uL (ref 0.1–1.0)
Monocytes Relative: 6 %
Neutro Abs: 2.4 10*3/uL (ref 1.7–7.7)
Neutrophils Relative %: 54 %
Platelets: 158 10*3/uL (ref 150–400)
RBC: 5.25 MIL/uL (ref 4.22–5.81)
RDW: 12.8 % (ref 11.5–15.5)
WBC: 4.3 10*3/uL (ref 4.0–10.5)
nRBC: 0 % (ref 0.0–0.2)

## 2018-09-10 LAB — FERRITIN: Ferritin: 323 ng/mL (ref 24–336)

## 2018-09-10 LAB — D-DIMER, QUANTITATIVE: D-Dimer, Quant: 0.41 ug/mL-FEU (ref 0.00–0.50)

## 2018-09-10 LAB — C-REACTIVE PROTEIN: CRP: 5 mg/dL — ABNORMAL HIGH (ref <1.0)

## 2018-09-10 LAB — LACTATE DEHYDROGENASE: LDH: 238 U/L — ABNORMAL HIGH (ref 98–192)

## 2018-09-10 MED ORDER — FAMOTIDINE 20 MG PO TABS
20.0000 mg | ORAL_TABLET | Freq: Every day | ORAL | Status: DC
  Administered 2018-09-10 – 2018-09-12 (×3): 20 mg via ORAL
  Filled 2018-09-10 (×4): qty 1

## 2018-09-10 MED ORDER — VITAMIN C 500 MG PO TABS
500.0000 mg | ORAL_TABLET | Freq: Every day | ORAL | Status: DC
  Administered 2018-09-10 – 2018-09-12 (×3): 500 mg via ORAL
  Filled 2018-09-10 (×3): qty 1

## 2018-09-10 MED ORDER — METHYLPREDNISOLONE SODIUM SUCC 40 MG IJ SOLR
40.0000 mg | Freq: Two times a day (BID) | INTRAMUSCULAR | Status: DC
  Administered 2018-09-10 – 2018-09-12 (×5): 40 mg via INTRAVENOUS
  Filled 2018-09-10 (×6): qty 1

## 2018-09-10 MED ORDER — ZINC SULFATE 220 (50 ZN) MG PO CAPS
220.0000 mg | ORAL_CAPSULE | Freq: Every day | ORAL | Status: DC
  Administered 2018-09-10 – 2018-09-12 (×3): 220 mg via ORAL
  Filled 2018-09-10 (×3): qty 1

## 2018-09-10 NOTE — Progress Notes (Signed)
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  Diagnosed with COVID-19 infection.  Chest x-ray did not show any obvious pneumonia.  He is noted to be dehydrated with elevated creatinine and hyponatremia.  Admitted for further management at [facility].  Reason for Visit: Acute respiratory disease secondary to COVID-19  Consultants: None  Procedures: None  Antibiotics: It appears the patient was given ceftriaxone and azithromycin in the ED.  Not continued.  Subjective/Interval History: Patient used his hearing aid to communicate with me.  He states that his shortness of breath has worsened.  He is having a cough with grayish expectoration.  Denies any chest pain.  No nausea vomiting or diarrhea.   Assessment/Plan:  Acute respiratory disease due to Acute Covid 19 Viral Illness  COVID-19 Labs  Recent Labs    09/08/18 0334 09/08/18 0339 09/09/18 0351 09/10/18 0410 09/10/18 0425  DDIMER 0.38  --  0.41 0.41  --   FERRITIN  --   --   --   --  323  LDH  --   --   --  238*  --   CRP  --  1.6* 3.7*  --  5.0*    Lab Results  Component Value Date   SARSCOV2NAA POSITIVE (A) 09/06/2018     Fever: Continues to be febrile.  Temperature was 103 F this morning Oxygen requirements: Currently still on room air saturating in the early 90s Antibiotics: Currently not on antibiotics Remdesivir: Not yet Steroids: Will initiate steroids today Diuretics: Not on scheduled diuretics Actemra: None yet Convalescent Plasma: Not yet Vitamin C and Zinc: Will initiate DVT Prophylaxis:  Lovenox 40 mg once daily  Prone positioning: Patient encouraged to stay in prone position as much as possible. Inflammatory markers  and d-dimer as above.  Patient continues to be febrile.  He was complaining of sore throat.  A CT scan of his neck was done which did not reveal any acute findings.  Fever is most likely due to the COVID-19.  Patient has been here in the hospital about 4 days.  Did about 4 days prior to admission which was about 8 days ago from today.  His inflammatory markers are slowly trending upwards.  We will repeat a chest x-ray.  We will place him on steroids.  Hold off on Actemra.  Likely does not meet criteria for Remdesivir due to prolonged duration of symptoms.  Prone position as much as possible.  Incentive spirometry.  Mobilization.   The treatment plan and use of medications and known side effects were discussed with patient. It was clearly explained that there is no proven definitive treatment for COVID-19 infection yet. Any medications used here are based on case reports/anecdotal data which are not peer-reviewed and has not been studied using randomized control trials.  Complete risks and long-term side effects are unknown, however in the best clinical judgment they seem to be of some clinical benefit rather than medical risks.  Patient agreed with the treatment plan and want to receive these treatments as indicated.  He is agreeable to steroids and Actemra.  Mild acute renal failure Creatinine had improved.  Mild rise in creatinine noted.  GFR greater than 60.  Monitor urine output.  Hyponatremia Most likely due to volume depletion.  Improved with hydration.  Stable this morning.  DVT prophylaxis: Lovenox PUD Prophylaxis: Initiate famotidine Code Status: Full code Family Communication: Will discuss with his parents Disposition Plan: Remain in the progressive care unit for now.   Medications:  Scheduled: . enoxaparin (LOVENOX) injection  40 mg Subcutaneous Q24H  . sodium chloride flush  3 mL Intravenous Q12H  . sodium chloride flush  3 mL Intravenous Q12H   Continuous: . sodium chloride      ZOX:WRUEAV chloride, acetaminophen, guaiFENesin-dextromethorphan, HYDROcodone-acetaminophen, lidocaine, menthol-cetylpyridinium, morphine injection, ondansetron **OR** ondansetron (ZOFRAN) IV, phenol, polyethylene glycol, sodium chloride flush   Objective:  Vital Signs  Vitals:   09/10/18 0600 09/10/18 0602 09/10/18 0620 09/10/18 1000  BP:      Pulse: 94 92 95   Resp: (!) Temp:   (!) 102.5 F (39.2 C) 99.1 F (37.3 C)  TempSrc:   Oral   SpO2: 94% 95% 93%   Weight:      Height:        Intake/Output Summary (Last 24 hours) at 09/10/2018 1110 Last data filed at 09/10/2018 0600 Gross per 24 hour  Intake 2308.79 ml  Output 900 ml  Net 1408.79 ml   Filed Weights   09/06/18 1635 09/07/18 0400  Weight: 83.9 kg 84 kg    General appearance: Awake alert.  In no distress Neck is soft and supple Resp: Coarse breath sounds bilaterally.  Few crackles at the bases.  No wheezing.   Cardio: S1-S2 is normal regular.  No S3-S4.  No rubs murmurs or bruit GI: Abdomen is soft.  Nontender nondistended.  Bowel sounds are present normal.  No masses organomegaly Extremities: No edema.  Full range of motion of lower extremities. Neurologic: Alert and oriented x3.  No focal neurological deficits.    Lab Results:  Data Reviewed: I have personally reviewed following labs and imaging studies  CBC: Recent Labs  Lab 09/06/18 1740 09/08/18 0334 09/09/18 0351 09/10/18 0410  WBC 4.4 5.0 4.6 4.3  NEUTROABS 2.6 3.1 2.9 2.4  HGB 15.3 15.2 14.2 14.6  HCT 46.2 45.4 42.9 43.8  MCV 83.5 83.3 83.6 83.4  PLT 193 176 174 158    Basic Metabolic Panel: Recent Labs  Lab 09/06/18 1740 09/07/18 1308 09/08/18 0334 09/09/18 0351 09/10/18 0410  NA 129* 136 135 134* 134*  K 4.4 3.6 3.3* 3.9 3.9  CL 98 102 103 105 104  CO2 20* 24 23 21* 22  GLUCOSE 110* 127* 98 92 87  BUN CREATININE 1.38* 1.24 1.22 1.21 1.29*  CALCIUM 8.2* 8.5* 8.6* 8.1* 8.4*    GFR: Estimated  Creatinine Clearance: 83.5 mL/min (A) (by C-G formula based on SCr of 1.29 mg/dL (H)).  Liver Function Tests: Recent Labs  Lab 09/06/18 1740 09/07/18 1308 09/08/18 0334 09/09/18 0351 09/10/18 0410  AST 55* 33 27 28 36  ALT 57* 45* 38 31 35  ALKPHOS 78 85 76 67 77  BILITOT 0.9 0.2* 0.3 0.5 0.4  PROT 7.4 8.0 7.5 7.4 7.6  ALBUMIN 3.6 3.8 3.5 3.5 3.5    Cardiac Enzymes: Recent Labs  Lab 09/06/18 2025  TROPONINI <0.03    Anemia Panel: Recent Labs    09/10/18 0425  FERRITIN 323    Recent Results (from the past 240 hour(s))  Group A Strep by PCR     Status: None   Collection Time: 09/06/18  5:17 PM  Result Value Ref Range Status   Group  A Strep by PCR NOT DETECTED NOT DETECTED Final    Comment: Performed at [facility], [address], [place], [place] [address]  SARS Coronavirus 2 [facility] order, Performed in [facility] hospital lab)     Status: Abnormal   Collection Time: 09/06/18  5:41 PM  Result Value Ref Range Status   SARS Coronavirus 2 POSITIVE (A) NEGATIVE Final    Comment: RESULT CALLED TO, READ BACK BY AND VERIFIED WITH: RN A DENNIS [id number] MLM (NOTE) If result is NEGATIVE SARS-CoV-2 target nucleic acids are NOT DETECTED. The SARS-CoV-2 RNA is generally detectable in upper and lower  respiratory specimens during the acute phase of infection. The lowest  concentration of SARS-CoV-2 viral copies this assay can detect is 250  copies / mL. A negative result does not preclude SARS-CoV-2 infection  and should not be used as the sole basis for treatment or other  patient management decisions.  A negative result may occur with  improper specimen collection / handling, submission of specimen other  than nasopharyngeal swab, presence of viral mutation(s) within the  areas targeted by this assay, and inadequate number of viral copies  (<250 copies / mL). A negative result must be combined with clinical  observations, patient history, and epidemiological  information. If result is POSITIVE SARS-CoV-2 target nucleic acids are DETECTED. The SARS- CoV-2 RNA is generally detectable in upper and lower  respiratory specimens during the acute phase of infection.  Positive  results are indicative of active infection with SARS-CoV-2.  Clinical  correlation with patient history and other diagnostic information is  necessary to determine patient infection status.  Positive results do  not rule out bacterial infection or co-infection with other viruses. If result is PRESUMPTIVE POSTIVE SARS-CoV-2 nucleic acids MAY BE PRESENT.   A presumptive positive result was obtained on the submitted specimen  and confirmed on repeat testing.  While 2019 novel coronavirus  (SARS-CoV-2) nucleic acids may be present in the submitted sample  additional confirmatory testing may be necessary for epidemiological  and / or clinical management purposes  to differentiate between  SARS-CoV-2 and other Sarbecovirus currently known to infect humans.  If clinically indicated additional testing with an alternate test  methodology [phone]) is advised . The SARS-CoV-2 RNA is generally  detectable in upper and lower respiratory specimens during the acute  phase of infection. The expected result is Negative. Fact Sheet for Patients:  BoilerBrush.com.cy Fact Sheet for Healthcare Providers: https://pope.com/ This test is not yet approved or cleared by the [place] FDA and has been authorized for detection and/or diagnosis of SARS-CoV-2 by FDA under an Emergency Use Authorization (EUA).  This EUA will remain in effect (meaning this test can be used) for the duration of the COVID-19 declaration under Section 564(b)(1) of the Act, 21 U.S.C. section 360bbb-3(b)(1), unless the authorization is terminated or revoked sooner. Performed at [facility], [address] [place], [place] [address]   Blood Culture (routine x 2)      Status: None (Preliminary result)   Collection Time: 09/06/18  8:12 PM  Result Value Ref Range Status   Specimen Description BLOOD LEFT HAND  Final   Special Requests   Final    BOTTLES DRAWN AEROBIC AND ANAEROBIC Blood Culture adequate volume   Culture   Final    NO GROWTH 3 DAYS Performed at [facility], [address], [place], [place] [address]    Report Status PENDING  Incomplete  Blood  Culture (routine x 2)     Status: None (Preliminary result)   Collection Time: 09/06/18  8:23 PM  Result Value Ref Range Status   Specimen Description BLOOD LEFT FOREARM  Final   Special Requests   Final    BOTTLES DRAWN AEROBIC AND ANAEROBIC Blood Culture results may not be optimal due to an inadequate volume of blood received in culture bottles   Culture   Final    NO GROWTH 3 DAYS Performed at [facility], [address] [place], [place] [address]    Report Status PENDING  Incomplete  Group A Strep by PCR     Status: None   Collection Time: 09/09/18  5:40 PM  Result Value Ref Range Status   Group A Strep by PCR NOT DETECTED NOT DETECTED Final    Comment: Performed at [facility], [address] [place], [place] [address]      Radiology Studies: Ct Soft Tissue Neck W Contrast  Result Date: 09/09/2018 CLINICAL DATA:  Fever.  No known source. EXAM: CT NECK WITH CONTRAST TECHNIQUE: Multidetector CT imaging of the neck was performed using the standard protocol following the bolus administration of intravenous contrast. CONTRAST:  75mL OMNIPAQUE IOHEXOL 300 MG/ML  SOLN COMPARISON:  CT neck 09/21/2010 FINDINGS: PHARYNX AND LARYNX: --Nasopharynx: Fossae of Rosenmuller are clear. Normal adenoid tonsils for age. --Oral cavity and oropharynx: The palatine and lingual tonsils are normal. The visible oral cavity and floor of mouth are normal. --Hypopharynx: Normal vallecula and pyriform sinuses. --Larynx: Normal epiglottis and pre-epiglottic space. Normal aryepiglottic  and vocal folds. --Retropharyngeal space: No abscess, effusion or lymphadenopathy. SALIVARY GLANDS: --Parotid: No mass lesion or inflammation. No sialolithiasis or ductal dilatation. --Submandibular: Symmetric without inflammation. No sialolithiasis or ductal dilatation. --Sublingual: Normal. No ranula or other visible lesion of the base of tongue and floor of mouth. THYROID: Normal. LYMPH NODES: No enlarged or abnormal density lymph nodes. Left level 2B nodes measure up to 8 mm. VASCULAR: Major cervical vessels are patent. LIMITED INTRACRANIAL: Normal. VISUALIZED ORBITS: Normal. MASTOIDS AND VISUALIZED PARANASAL SINUSES: No fluid levels or advanced mucosal thickening. No mastoid effusion. SKELETON: No bony spinal canal stenosis. No lytic or blastic lesions. UPPER CHEST: No apical consolidation. OTHER: None. IMPRESSION: No acute abnormality of the cervical soft tissues. Electronically Signed   By: Deatra RobinsonKevin  Herman M.D.   On: 09/09/2018 19:51       LOS: 4 days   Caniya Tagle Rito EhrlichKrishnan  [facility] Pager on [email or web address]  09/10/2018, 11:10 AM

## 2018-09-10 NOTE — Plan of Care (Signed)
Discussed with patient plan of care for the evening, pain management and fever reducing medications with some teach back displayed

## 2018-09-11 LAB — CULTURE, BLOOD (ROUTINE X 2)
Culture: NO GROWTH
Culture: NO GROWTH
Special Requests: ADEQUATE

## 2018-09-11 LAB — CBC WITH DIFFERENTIAL/PLATELET
Abs Immature Granulocytes: 0.02 10*3/uL (ref 0.00–0.07)
Basophils Absolute: 0 10*3/uL (ref 0.0–0.1)
Basophils Relative: 0 %
Eosinophils Absolute: 0 10*3/uL (ref 0.0–0.5)
Eosinophils Relative: 0 %
HCT: 42.5 % (ref 39.0–52.0)
Hemoglobin: 14 g/dL (ref 13.0–17.0)
Immature Granulocytes: 1 %
Lymphocytes Relative: 19 %
Lymphs Abs: 0.7 10*3/uL (ref 0.7–4.0)
MCH: 27 pg (ref 26.0–34.0)
MCHC: 32.9 g/dL (ref 30.0–36.0)
MCV: 81.9 fL (ref 80.0–100.0)
Monocytes Absolute: 0.2 10*3/uL (ref 0.1–1.0)
Monocytes Relative: 4 %
Neutro Abs: 2.8 10*3/uL (ref 1.7–7.7)
Neutrophils Relative %: 76 %
Platelets: 198 10*3/uL (ref 150–400)
RBC: 5.19 MIL/uL (ref 4.22–5.81)
RDW: 12.5 % (ref 11.5–15.5)
WBC: 3.6 10*3/uL — ABNORMAL LOW (ref 4.0–10.5)
nRBC: 0 % (ref 0.0–0.2)

## 2018-09-11 LAB — COMPREHENSIVE METABOLIC PANEL
ALT: 77 U/L — ABNORMAL HIGH (ref 0–44)
AST: 83 U/L — ABNORMAL HIGH (ref 15–41)
Albumin: 3.4 g/dL — ABNORMAL LOW (ref 3.5–5.0)
Alkaline Phosphatase: 109 U/L (ref 38–126)
Anion gap: 10 (ref 5–15)
BUN: 16 mg/dL (ref 6–20)
CO2: 21 mmol/L — ABNORMAL LOW (ref 22–32)
Calcium: 8.6 mg/dL — ABNORMAL LOW (ref 8.9–10.3)
Chloride: 102 mmol/L (ref 98–111)
Creatinine, Ser: 1.08 mg/dL (ref 0.61–1.24)
GFR calc Af Amer: >60 mL/min (ref >60)
GFR calc non Af Amer: >60 mL/min (ref >60)
Glucose, Bld: 136 mg/dL — ABNORMAL HIGH (ref 70–99)
Potassium: 4.2 mmol/L (ref 3.5–5.1)
Sodium: 133 mmol/L — ABNORMAL LOW (ref 135–145)
Total Bilirubin: 0.4 mg/dL (ref 0.3–1.2)
Total Protein: 7.8 g/dL (ref 6.5–8.1)

## 2018-09-11 LAB — D-DIMER, QUANTITATIVE: D-Dimer, Quant: 0.46 ug/mL-FEU (ref 0.00–0.50)

## 2018-09-11 LAB — GLUCOSE, CAPILLARY
Glucose-Capillary: 128 mg/dL — ABNORMAL HIGH (ref 70–99)
Glucose-Capillary: 166 mg/dL — ABNORMAL HIGH (ref 70–99)

## 2018-09-11 LAB — C-REACTIVE PROTEIN: CRP: 6 mg/dL — ABNORMAL HIGH (ref <1.0)

## 2018-09-11 LAB — LACTATE DEHYDROGENASE: LDH: 307 U/L — ABNORMAL HIGH (ref 98–192)

## 2018-09-11 LAB — FERRITIN: Ferritin: 553 ng/mL — ABNORMAL HIGH (ref 24–336)

## 2018-09-11 MED ORDER — POLYETHYLENE GLYCOL 3350 17 G PO PACK
17.0000 g | PACK | Freq: Every day | ORAL | Status: DC
  Administered 2018-09-11 – 2018-09-12 (×2): 17 g via ORAL
  Filled 2018-09-11 (×2): qty 1

## 2018-09-11 NOTE — Progress Notes (Signed)
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  Diagnosed with COVID-19 infection.  Chest x-ray did not show any obvious pneumonia.  He is noted to be dehydrated with elevated creatinine and hyponatremia.  Ad[facility] for [facility].  Reason for Visit: Acute respiratory disease secondary to COVID-19  Consultants: None  Procedures: None  Antibiotics: It appears the patient was given ceftriaxone and azithromycin in the ED.  Not continued.  Subjective/Interval History: Patient used his hearing aid to communicate with me.  Patient states that he continues to have cough occasionally.  Feeling of soreness in his throat also persists.  However does not appear to be more short of breath compared to yesterday.  Complains of constipation.   Assessment/Plan:  Acute respiratory disease due to Acute Covid 19 Viral Illness  COVID-19 Labs  Recent Labs    09/09/18 0351 09/10/18 0410 09/10/18 0425 09/11/18 0330 09/11/18 0500  DDIMER 0.41 0.41  --  0.46  --   FERRITIN  --   --  323  --  553*  LDH  --  238*  --  307*  --   CRP 3.7*  --  5.0*  --  6.0*    Lab Results  Component Value Date   SARSCOV2NAA POSITIVE (A) 09/06/2018     Fever: Temperature 100.5 yesterday afternoon.  No fever since then  Oxygen requirements: Saturating in the 90s on room air. Antibiotics: Currently not on antibiotics Remdesivir: Not given due to prolonged duration of symptoms Steroids: Solu-Medrol 40 mg every 12 hours Diuretics: Not on scheduled diuretics Actemra: None yet Convalescent Plasma: Not yet Vitamin C and Zinc: Continue DVT Prophylaxis:  Lovenox 40 mg once daily  Prone positioning: Patient  encouraged to stay in prone position as much as possible. Inflammatory markers and d-dimer as above.  Fever trend appears to be improving which could be due to steroids.  Patient continues to have soreness in his throat.  CT scan was done recently which did not show any acute findings.  Patient continues to saturate normal on room air.  He has been ambulating in the room without any difficulties.  No indication for Actemra currently.  Does not meet criteria for Remdesivir due to prolonged duration of symptoms.  Continue chronic as much as possible.  Incentive spirometry.  Mobilization.  CRP noted to be slightly elevated today compared to yesterday.  Recheck tomorrow.  Ferritin 553.  Chest x-ray done yesterday showed bibasilar infiltrates versus edema.  Clinically he seems to be stable.  The treatment plan and use of medications and known side effects were discussed with patient. It was clearly explained that there is no proven definitive treatment for COVID-19 infection yet. Any medications used here are based on case reports/anecdotal data which are not peer-reviewed and has not been studied using randomized control trials.  Complete risks and long-term side effects are unknown, however in the best clinical judgment they seem to be of some clinical benefit rather than medical risks.  Patient agreed with the treatment plan and want to receive these treatments as indicated.  He was given steroids.  Mild acute renal failure Renal function has improved.  Monitor urine output.    Hyponatremia Sodium levels low but stable.  Continue to monitor periodically.  Mild transaminitis Mildly elevated AST and ALT noted today.  Continue to monitor periodically.    Constipation Patient complains of constipation this morning.  MiraLAX.  DVT prophylaxis: Lovenox PUD Prophylaxis: famotidine Code Status: Full code Family Communication: Discussed with the sister yesterday Disposition Plan: Remain in the  progressive care unit for now.   Medications:  Scheduled: . enoxaparin (LOVENOX) injection  40 mg Subcutaneous Q24H  . famotidine  20 mg Oral Daily  . methylPREDNISolone (SOLU-MEDROL) injection  40 mg Intravenous BID  . polyethylene glycol  17 g Oral Daily  . sodium chloride flush  3 mL Intravenous Q12H  . sodium chloride flush  3 mL Intravenous Q12H  . vitamin C  500 mg Oral Daily  . zinc sulfate  220 mg Oral Daily   Continuous: . sodium chloride     YME:BRAXEN chloride, acetaminophen, guaiFENesin-dextromethorphan, HYDROcodone-acetaminophen, lidocaine, menthol-cetylpyridinium, morphine injection, ondansetron **OR** ondansetron (ZOFRAN) IV, phenol, sodium chloride flush   Objective:  Vital Signs  Vitals:   09/10/18 2341 09/11/18 0006 09/11/18 0300 09/11/18 0928  BP: 97/66 103/71 100/70 94/64  Pulse: 93 93 90 (!) 107  Resp: (!) 26 (!) 35 (!) 34 20  Temp:  98.8 F (37.1 C) 98.4 F (36.9 C) 97.7 F (36.5 C)  TempSrc:  Oral Oral Oral  SpO2: 94% 94% 94% 95%  Weight:      Height:        Intake/Output Summary (Last 24 hours) at 09/11/2018 1004 Last data filed at 09/11/2018 0959 Gross per 24 hour  Intake -  Output 1300 ml  Net -1300 ml   Filed Weights   09/06/18 1635 09/07/18 0400  Weight: 83.9 kg 84 kg   General appearance: Awake alert.  In no distress Resp: Crackles bilateral bases.  No tachypnea.  No wheezing.   Cardio: S1-S2 is normal regular.  No S3-S4.  No rubs murmurs or bruit GI: Abdomen is soft.  Nontender nondistended.  Bowel sounds are present normal.  No masses organomegaly Extremities: No edema.  Full range of motion of lower extremities. Neurologic: Alert and oriented x3.  No focal neurological deficits.    Lab Results:  Data Reviewed: I have personally reviewed following labs and imaging studies  CBC: Recent Labs  Lab 09/06/18 1740 09/08/18 0334 09/09/18 0351 09/10/18 0410 09/11/18 0330  WBC 4.4 5.0 4.6 4.3 3.6*  NEUTROABS 2.6 3.1 2.9 2.4  2.8  HGB 15.3 15.2 14.2 14.6 14.0  HCT 46.2 45.4 42.9 43.8 42.5  MCV 83.5 83.3 83.6 83.4 81.9  PLT 193 176 174 158 198    Basic Metabolic Panel: Recent Labs  Lab 09/07/18 1308 09/08/18 0334 09/09/18 0351 09/10/18 0410 09/11/18 0330  NA 136 135 134* 134* 133*  K 3.6 3.3* 3.9 3.9 4.2  CL 102 103 105 104 102  CO2 24 23 21* 22 21*  GLUCOSE 127* 98 92 87 136*  BUN 11 10 14 14 16   CREATININE 1.24 1.22 1.21 1.29* 1.08  CALCIUM 8.5* 8.6* 8.1* 8.4* 8.6*    GFR: Estimated Creatinine Clearance: 99.7 mL/min (by C-G formula based on SCr of 1.08 mg/dL).  Liver Function Tests: Recent Labs  Lab 09/07/18 1308 09/08/18 0334 09/09/18 0351 09/10/18 0410 09/11/18 0330  AST 33 27 28 36 83*  ALT 45* 38 31 35 77*  ALKPHOS 85 76 67 77 109  BILITOT 0.2* 0.3 0.5 0.4 0.4  PROT 8.0 7.5 7.4 7.6 7.8  ALBUMIN 3.8 3.5 3.5 3.5 3.4*    Cardiac Enzymes: Recent Labs  Lab 09/06/18 2025  TROPONINI <0.03    Anemia Panel: Recent Labs    09/10/18 0425 09/11/18 0500  FERRITIN 323 553*    Recent Results (from the past 240 hour(s))  Group A Strep by PCR     Status: None   Collection Time: 09/06/18  5:17 PM  Result Value Ref Range Status   Group A Strep by PCR NOT DETECTED NOT DETECTED Final    Comment: Performed at [facility], [address] [place], [place] [address]  SARS Coronavirus 2 [facility] order, Performed in [facility] hospital lab)     Status: Abnormal   Collection Time: 09/06/18  5:41 PM  Result Value Ref Range Status   SARS Coronavirus 2 POSITIVE (A) NEGATIVE Final    Comment: RESULT CALLED TO, READ BACK BY AND VERIFIED WITH: RN A DENNIS [id number] 2002 MLM (NOTE) If result is NEGATIVE SARS-CoV-2 target nucleic acids are NOT DETECTED. The SARS-CoV-2 RNA is generally detectable in upper and lower  respiratory specimens during the acute phase of infection. The lowest  concentration of SARS-CoV-2 viral copies this assay can detect is 250  copies / mL. A negative result  does not preclude SARS-CoV-2 infection  and should not be used as the sole basis for treatment or other  patient management decisions.  A negative result may occur with  improper specimen collection / handling, submission of specimen other  than nasopharyngeal swab, presence of viral mutation(s) within the  areas targeted by this assay, and inadequate number of viral copies  (<250 copies / mL). A negative result must be combined with clinical  observations, patient history, and epidemiological information. If result is POSITIVE SARS-CoV-2 target nucleic acids are DETECTED. The SARS- CoV-2 RNA is generally detectable in upper and lower  respiratory specimens during the acute phase of infection.  Positive  results are indicative of active infection with SARS-CoV-2.  Clinical  correlation with patient history and other diagnostic information is  necessary to determine patient infection status.  Positive results do  not rule out bacterial infection or co-infection with other viruses. If result is PRESUMPTIVE POSTIVE SARS-CoV-2 nucleic acids MAY BE PRESENT.   A presumptive positive result was obtained on the submitted specimen  and confirmed on repeat testing.  While 2019 novel coronavirus  (SARS-CoV-2) nucleic acids may be present in the submitted sample  additional confirmatory testing may be necessary for epidemiological  and / or clinical management purposes  to differentiate between  SARS-CoV-2 and other Sarbecovirus currently known to infect humans.  If clinically indicated additional testing with an alternate test  methodology [phone]) is advised . The SARS-CoV-2 RNA is generally  detectable in upper and lower respiratory specimens during the acute  phase of infection. The expected result is Negative. Fact Sheet for Patients:  BoilerBrush.com.cy Fact Sheet for Healthcare Providers: https://pope.com/ This test is not yet approved or  cleared by the [place] FDA and has been authorized for detection and/or diagnosis of SARS-CoV-2 by FDA under an Emergency Use Authorization (EUA).  This EUA will remain in effect (meaning this test can be used) for the duration of the COVID-19 declaration under Section 564(b)(1) of the Act, 21 U.S.C. section 360bbb-3(b)(1), unless the authorization is terminated or revoked sooner. Performed at [facility], [address] [place], [place] [address]   Blood Culture (routine x 2)     Status: None (Preliminary result)   Collection Time: 09/06/18  8:12 PM  Result Value Ref Range Status  Specimen Description BLOOD LEFT HAND  Final   Special Requests   Final    BOTTLES DRAWN AEROBIC AND ANAEROBIC Blood Culture adequate volume   Culture   Final    NO GROWTH 4 DAYS Performed at [facility], [address] [place], [place] [address]    Report Status PENDING  Incomplete  Blood Culture (routine x 2)     Status: None (Preliminary result)   Collection Time: 09/06/18  8:23 PM  Result Value Ref Range Status   Specimen Description BLOOD LEFT FOREARM  Final   Special Requests   Final    BOTTLES DRAWN AEROBIC AND ANAEROBIC Blood Culture results may not be optimal due to an inadequate volume of blood received in culture bottles   Culture   Final    NO GROWTH 4 DAYS Performed at [facility], [address] [place], [place] [address]    Report Status PENDING  Incomplete  Group A Strep by PCR     Status: None   Collection Time: 09/09/18  5:40 PM  Result Value Ref Range Status   Group A Strep by PCR NOT DETECTED NOT DETECTED Final    Comment: Performed at [facility], [address], [place], [place] [address]      Radiology Studies: Ct Soft Tissue Neck W Contrast  Result Date: 09/09/2018 CLINICAL DATA:  Fever.  No known source. EXAM: CT NECK WITH CONTRAST TECHNIQUE: Multidetector CT imaging of the neck was performed using the standard protocol  following the bolus administration of intravenous contrast. CONTRAST:  75mL OMNIPAQUE IOHEXOL 300 MG/ML  SOLN COMPARISON:  CT neck 09/21/2010 FINDINGS: PHARYNX AND LARYNX: --Nasopharynx: Fossae of Rosenmuller are clear. Normal adenoid tonsils for age. --Oral cavity and oropharynx: The palatine and lingual tonsils are normal. The visible oral cavity and floor of mouth are normal. --Hypopharynx: Normal vallecula and pyriform sinuses. --Larynx: Normal epiglottis and pre-epiglottic space. Normal aryepiglottic and vocal folds. --Retropharyngeal space: No abscess, effusion or lymphadenopathy. SALIVARY GLANDS: --Parotid: No mass lesion or inflammation. No sialolithiasis or ductal dilatation. --Submandibular: Symmetric without inflammation. No sialolithiasis or ductal dilatation. --Sublingual: Normal. No ranula or other visible lesion of the base of tongue and floor of mouth. THYROID: Normal. LYMPH NODES: No enlarged or abnormal density lymph nodes. Left level 2B nodes measure up to 8 mm. VASCULAR: Major cervical vessels are patent. LIMITED INTRACRANIAL: Normal. VISUALIZED ORBITS: Normal. MASTOIDS AND VISUALIZED PARANASAL SINUSES: No fluid levels or advanced mucosal thickening. No mastoid effusion. SKELETON: No bony spinal canal stenosis. No lytic or blastic lesions. UPPER CHEST: No apical consolidation. OTHER: None. IMPRESSION: No acute abnormality of the cervical soft tissues. Electronically Signed   By: Deatra Robinson M.D.   On: 09/09/2018 19:51   Dg Chest Port 1 View  Result Date: 09/10/2018 CLINICAL DATA:  Shortness of breath. EXAM: PORTABLE CHEST 1 VIEW COMPARISON:  09/06/2018 FINDINGS: Cardiomegaly. Low lung volumes. Bibasilar infiltrates/edema. No pleural effusion or pneumothorax. No acute bony abnormality. IMPRESSION: 1.  Cardiomegaly with bibasilar infiltrates and/or edema. 2.  Low lung volumes. Electronically Signed   By: Maisie Fus  Register   On: 09/10/2018 12:58       LOS: 5 days   Osvaldo Shipper  [facility] Pager on [email or web address]  09/11/2018, 10:04 AM

## 2018-09-12 LAB — COMPREHENSIVE METABOLIC PANEL
ALT: 201 U/L — ABNORMAL HIGH (ref 0–44)
AST: 164 U/L — ABNORMAL HIGH (ref 15–41)
Albumin: 3.3 g/dL — ABNORMAL LOW (ref 3.5–5.0)
Alkaline Phosphatase: 105 U/L (ref 38–126)
Anion gap: 9 (ref 5–15)
BUN: 19 mg/dL (ref 6–20)
CO2: 22 mmol/L (ref 22–32)
Calcium: 8.3 mg/dL — ABNORMAL LOW (ref 8.9–10.3)
Chloride: 103 mmol/L (ref 98–111)
Creatinine, Ser: 1.02 mg/dL (ref 0.61–1.24)
GFR calc Af Amer: >60 mL/min (ref >60)
GFR calc non Af Amer: >60 mL/min (ref >60)
Glucose, Bld: 150 mg/dL — ABNORMAL HIGH (ref 70–99)
Potassium: 4.5 mmol/L (ref 3.5–5.1)
Sodium: 134 mmol/L — ABNORMAL LOW (ref 135–145)
Total Bilirubin: 0.3 mg/dL (ref 0.3–1.2)
Total Protein: 7.6 g/dL (ref 6.5–8.1)

## 2018-09-12 LAB — CBC WITH DIFFERENTIAL/PLATELET
Abs Immature Granulocytes: 0.05 10*3/uL (ref 0.00–0.07)
Basophils Absolute: 0 10*3/uL (ref 0.0–0.1)
Basophils Relative: 0 %
Eosinophils Absolute: 0 10*3/uL (ref 0.0–0.5)
Eosinophils Relative: 0 %
HCT: 42.8 % (ref 39.0–52.0)
Hemoglobin: 14.1 g/dL (ref 13.0–17.0)
Immature Granulocytes: 1 %
Lymphocytes Relative: 9 %
Lymphs Abs: 0.9 10*3/uL (ref 0.7–4.0)
MCH: 27 pg (ref 26.0–34.0)
MCHC: 32.9 g/dL (ref 30.0–36.0)
MCV: 82 fL (ref 80.0–100.0)
Monocytes Absolute: 0.3 10*3/uL (ref 0.1–1.0)
Monocytes Relative: 3 %
Neutro Abs: 8.8 10*3/uL — ABNORMAL HIGH (ref 1.7–7.7)
Neutrophils Relative %: 87 %
Platelets: 246 10*3/uL (ref 150–400)
RBC: 5.22 MIL/uL (ref 4.22–5.81)
RDW: 12.7 % (ref 11.5–15.5)
WBC: 10 10*3/uL (ref 4.0–10.5)
nRBC: 0 % (ref 0.0–0.2)

## 2018-09-12 LAB — C-REACTIVE PROTEIN: CRP: 3.7 mg/dL — ABNORMAL HIGH (ref <1.0)

## 2018-09-12 LAB — FERRITIN: Ferritin: 716 ng/mL — ABNORMAL HIGH (ref 24–336)

## 2018-09-12 LAB — LACTATE DEHYDROGENASE: LDH: 374 U/L — ABNORMAL HIGH (ref 98–192)

## 2018-09-12 MED ORDER — FAMOTIDINE 20 MG PO TABS: 20.0000 mg | ORAL_TABLET | Freq: Every day | ORAL | 0 refills | Status: DC

## 2018-09-12 MED ORDER — ASCORBIC ACID 500 MG PO TABS: 500.0000 mg | ORAL_TABLET | Freq: Every day | ORAL | 0 refills | Status: AC

## 2018-09-12 MED ORDER — BENZONATATE 200 MG PO CAPS: 200.0000 mg | ORAL_CAPSULE | Freq: Three times a day (TID) | ORAL | 0 refills | Status: DC

## 2018-09-12 MED ORDER — ZINC SULFATE 220 (50 ZN) MG PO CAPS: 220.0000 mg | ORAL_CAPSULE | Freq: Every day | ORAL | 0 refills | Status: AC

## 2018-09-12 MED ORDER — PREDNISONE 20 MG PO TABS: ORAL_TABLET | ORAL | 0 refills | Status: DC

## 2018-09-12 MED ORDER — POLYETHYLENE GLYCOL 3350 17 G PO PACK: 17.0000 g | PACK | Freq: Every day | ORAL | 0 refills | Status: DC | PRN

## 2018-09-12 NOTE — Progress Notes (Signed)
Discussed discharge instructions with patient, patient verbalized understanding and agreement. Patient left in [facility] to go home in private vehicle.

## 2018-09-12 NOTE — Discharge Summary (Signed)
[facility]  Physician Discharge Summary   Patient ID: Antonio Li MRN: [id number] DOB/AGE: Dec 13, 1990 28 y.o.  Admit date: 09/06/2018 Discharge date: 09/12/2018  PCP: Ronnald Nian, MD  DISCHARGE DIAGNOSES:  Acute respiratory disease due to acute COVID-19 viral illness Pneumonia secondary to COVID-19 Mild acute renal failure, resolved Mild transaminitis Hyponatremia  RECOMMENDATIONS FOR OUTPATIENT FOLLOW UP: 1. 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 Diagnosed with COVID-19 infection. Chest x-ray did not show any obvious pneumonia. He is noted to be dehydrated with elevated creatinine and hyponatremia. Admitted for further management at [facility].   HOSPITAL COURSE:   Acute respiratory disease due to Acute Covid 19 Viral Illness Patient was admitted to the hospital.  He was placed on steroids.  He did not require any oxygen supplementation during this hospitalization.  He was not given Actemra.  Remdesivir was not given as he had a prolonged duration of symptoms.  Patient CRP peaked at 6.0.  He was also febrile initially.  His fevers have subsided.  He has been fever free for 48 hours.  He is ambulating in the hallway.  He continues to have a significant cough.  Oxygen saturations are still normal.  Patient was reassured about the cough.  He will be discharged on a prednisone taper.  Antitussive agents.  During the hospitalization patient was also complaining of soreness in his throat.  Strep throat test was negative.  Due to persistent symptoms he underwent CT scan of  his neck which did not show any acute findings.  Mild acute renal failure Renal function has improved.      Hyponatremia Sodium levels low but stable.   Mild transaminitis Patient noted to have mildly elevated AST and ALT.  These will need to be rechecked in the outpatient setting in 1 to 2 weeks.    Constipation Patient was prescribed MiraLAX.    Overall stable.  Okay for discharge home today.     PERTINENT LABS:  The results of significant diagnostics from this hospitalization (including imaging, microbiology, ancillary and laboratory) are listed below for reference.    Microbiology: Recent Results (from the past 240 hour(s))  Group A Strep by PCR     Status: None   Collection Time: 09/06/18  5:17 PM  Result Value Ref Range Status   Group A Strep by PCR NOT DETECTED NOT DETECTED Final    Comment: Performed at [facility], [address] [place], [place] [address]  SARS Coronavirus 2 [facility] order, Performed in [facility] hospital lab)     Status: Abnormal   Collection Time: 09/06/18  5:41 PM  Result Value Ref Range Status   SARS Coronavirus 2 POSITIVE (A) NEGATIVE Final    Comment: RESULT CALLED TO, READ BACK BY AND VERIFIED WITH: RN A DENNIS [id number] MLM (NOTE) If result is NEGATIVE SARS-CoV-2 target nucleic acids are NOT DETECTED. The SARS-CoV-2 RNA is generally detectable in upper and lower  respiratory specimens during the acute phase of infection. The lowest  concentration of SARS-CoV-2 viral copies this assay can detect is 250  copies / mL. A negative result does  not preclude SARS-CoV-2 infection  and should not be used as the sole basis for treatment or other  patient management decisions.  A negative result may occur with  improper specimen collection / handling, submission of specimen other  than nasopharyngeal swab, presence of viral mutation(s) within the  areas targeted by this assay, and inadequate number of viral copies  (<250 copies /  mL). A negative result must be combined with clinical  observations, patient history, and epidemiological information. If result is POSITIVE SARS-CoV-2 target nucleic acids are DETECTED. The SARS- CoV-2 RNA is generally detectable in upper and lower  respiratory specimens during the acute phase of infection.  Positive  results are indicative of active infection with SARS-CoV-2.  Clinical  correlation with patient history and other diagnostic information is  necessary to determine patient infection status.  Positive results do  not rule out bacterial infection or co-infection with other viruses. If result is PRESUMPTIVE POSTIVE SARS-CoV-2 nucleic acids MAY BE PRESENT.   A presumptive positive result was obtained on the submitted specimen  and confirmed on repeat testing.  While 2019 novel coronavirus  (SARS-CoV-2) nucleic acids may be present in the submitted sample  additional confirmatory testing may be necessary for epidemiological  and / or clinical management purposes  to differentiate between  SARS-CoV-2 and other Sarbecovirus currently known to infect humans.  If clinically indicated additional testing with an alternate test  methodology [phone]([id number]) is advised . The SARS-CoV-2 RNA is generally  detectable in upper and lower respiratory specimens during the acute  phase of infection. The expected result is Negative. Fact Sheet for Patients:  BoilerBrush.com.cyhttps://www.fda.gov/media/136312/download Fact Sheet for Healthcare Providers: https://pope.com/https://www.fda.gov/media/136313/download This test is not yet approved or cleared by the [place] FDA and has been authorized for detection and/or diagnosis of SARS-CoV-2 by FDA under an Emergency Use Authorization (EUA).  This EUA will remain in effect (meaning this test can be used) for the duration of the COVID-19 declaration under Section 564(b)(1) of the Act, 21 U.S.C. section 360bbb-3(b)(1), unless the authorization is terminated or revoked  sooner. Performed at [facility], [address] [place], [place] [address]   Blood Culture (routine x 2)     Status: None   Collection Time: 09/06/18  8:12 PM  Result Value Ref Range Status   Specimen Description BLOOD LEFT HAND  Final   Special Requests   Final    BOTTLES DRAWN AEROBIC AND ANAEROBIC Blood Culture adequate volume   Culture   Final    NO GROWTH 5 DAYS Performed at [facility], [address] [place], [place] [address]    Report Status 09/11/2018 FINAL  Final  Blood Culture (routine x 2)     Status: None   Collection Time: 09/06/18  8:23 PM  Result Value Ref Range Status   Specimen Description BLOOD LEFT FOREARM  Final   Special Requests   Final    BOTTLES DRAWN AEROBIC AND ANAEROBIC Blood Culture results may not be optimal due to an inadequate volume of blood received in culture bottles   Culture   Final    NO GROWTH 5 DAYS Performed at [facility], [address] [place], [place] [address]    Report Status 09/11/2018 FINAL  Final  Group A Strep by PCR     Status: None   Collection Time: 09/09/18  5:40 PM  Result Value Ref Range Status   Group A Strep by PCR NOT DETECTED NOT DETECTED Final  Comment: Performed at [facility], [address] [place], [place] [address]     Labs: Basic Metabolic Panel: Recent Labs  Lab 09/08/18 0334 09/09/18 0351 09/10/18 0410 09/11/18 0330 09/12/18 0314  NA 135 134* 134* 133* 134*  K 3.3* 3.9 3.9 4.2 4.5  CL 103 105 104 102 103  CO2 23 21* 22 21* 22  GLUCOSE 98 92 87 136* 150*  BUN CREATININE 1.22 1.21 1.29* 1.08 1.02  CALCIUM 8.6* 8.1* 8.4* 8.6* 8.3*   Liver Function Tests: Recent Labs  Lab 09/08/18 0334 09/09/18 0351 09/10/18 0410 09/11/18 0330 09/12/18 0314  AST 27 28 36 83* 164*  ALT 38 31 35 77* 201*  ALKPHOS 76 67 77 109 105  BILITOT 0.3 0.5 0.4 0.4 0.3  PROT 7.5 7.4 7.6 7.8 7.6  ALBUMIN 3.5 3.5 3.5 3.4* 3.3*   CBC: Recent Labs  Lab  09/08/18 0334 09/09/18 0351 09/10/18 0410 09/11/18 0330 09/12/18 0314  WBC 5.0 4.6 4.3 3.6* 10.0  NEUTROABS 3.1 2.9 2.4 2.8 8.8*  HGB 15.2 14.2 14.6 14.0 14.1  HCT 45.4 42.9 43.8 42.5 42.8  MCV 83.3 83.6 83.4 81.9 82.0  PLT 176 174 158 198 246   Cardiac Enzymes: Recent Labs  Lab 09/06/18 2025  TROPONINI <0.03   BNP: BNP (last 3 results) Recent Labs    09/06/18 2012  BNP 4.5    CBG: Recent Labs  Lab 09/11/18 1153 09/11/18 1609  GLUCAP 128* 166*     IMAGING STUDIES Ct Soft Tissue Neck W Contrast  Result Date: 09/09/2018 CLINICAL DATA:  Fever.  No known source. EXAM: CT NECK WITH CONTRAST TECHNIQUE: Multidetector CT imaging of the neck was performed using the standard protocol following the bolus administration of intravenous contrast. CONTRAST:  75mL OMNIPAQUE IOHEXOL 300 MG/ML  SOLN COMPARISON:  CT neck 09/21/2010 FINDINGS: PHARYNX AND LARYNX: --Nasopharynx: Fossae of Rosenmuller are clear. Normal adenoid tonsils for age. --Oral cavity and oropharynx: The palatine and lingual tonsils are normal. The visible oral cavity and floor of mouth are normal. --Hypopharynx: Normal vallecula and pyriform sinuses. --Larynx: Normal epiglottis and pre-epiglottic space. Normal aryepiglottic and vocal folds. --Retropharyngeal space: No abscess, effusion or lymphadenopathy. SALIVARY GLANDS: --Parotid: No mass lesion or inflammation. No sialolithiasis or ductal dilatation. --Submandibular: Symmetric without inflammation. No sialolithiasis or ductal dilatation. --Sublingual: Normal. No ranula or other visible lesion of the base of tongue and floor of mouth. THYROID: Normal. LYMPH NODES: No enlarged or abnormal density lymph nodes. Left level 2B nodes measure up to 8 mm. VASCULAR: Major cervical vessels are patent. LIMITED INTRACRANIAL: Normal. VISUALIZED ORBITS: Normal. MASTOIDS AND VISUALIZED PARANASAL SINUSES: No fluid levels or advanced mucosal thickening. No mastoid effusion. SKELETON: No bony  spinal canal stenosis. No lytic or blastic lesions. UPPER CHEST: No apical consolidation. OTHER: None. IMPRESSION: No acute abnormality of the cervical soft tissues. Electronically Signed   By: Deatra Robinson M.D.   On: 09/09/2018 19:51   Dg Chest Port 1 View  Result Date: 09/10/2018 CLINICAL DATA:  Shortness of breath. EXAM: PORTABLE CHEST 1 VIEW COMPARISON:  09/06/2018 FINDINGS: Cardiomegaly. Low lung volumes. Bibasilar infiltrates/edema. No pleural effusion or pneumothorax. No acute bony abnormality. IMPRESSION: 1.  Cardiomegaly with bibasilar infiltrates and/or edema. 2.  Low lung volumes. Electronically Signed   By: Maisie Fus  Register   On: 09/10/2018 12:58   Dg Chest Portable 1 View  Result Date: 09/06/2018 CLINICAL DATA:  Cough and fever EXAM: PORTABLE CHEST 1 VIEW  COMPARISON:  Chest x-ray dated 01/15/2008 FINDINGS: Low lung volumes. No pneumothorax. No significant pleural effusion. The heart size is unremarkable for the low lung volumes. There is no acute osseous abnormality detected. IMPRESSION: Low lung volumes, otherwise unremarkable chest x-ray. Electronically Signed   By: Katherine Mantle M.D.   On: 09/06/2018 19:02    DISCHARGE EXAMINATION: Vitals:   09/12/18 0604 09/12/18 0800 09/12/18 0830 09/12/18 1248  BP: 115/79 102/68 97/73   Pulse: 80 (!) 103    Resp: (!) 28 (!) 32    Temp: 98.8 F (37.1 C)  99 F (37.2 C) 98.6 F (37 C)  TempSrc: Oral     SpO2: 94% 92%    Weight:      Height:       General appearance: Awake alert.  In no distress Resp: Normal effort at rest.  Coarse breath sounds bilaterally.  No wheezing rales or rhonchi. Cardio: S1-S2 is normal regular.  No S3-S4.  No rubs murmurs or bruit GI: Abdomen is soft.  Nontender nondistended.  Bowel sounds are present normal.  No masses organomegaly Extremities: No edema.  Full range of motion of lower extremities. Neurologic: Alert and oriented x3.  No focal neurological deficits.    DISPOSITION: Home  Discharge  Instructions    Call MD for:  difficulty breathing, headache or visual disturbances   Complete by:  As directed    Call MD for:  extreme fatigue   Complete by:  As directed    Call MD for:  persistant dizziness or light-headedness   Complete by:  As directed    Call MD for:  persistant nausea and vomiting   Complete by:  As directed    Call MD for:  severe uncontrolled pain   Complete by:  As directed    Diet general   Complete by:  As directed    Discharge instructions   Complete by:  As directed    Please have your primary care provider check your liver function tests in 1 week.  COVID 19 DISCHARGE INSTRUCTIONS COVID 19 INSTRUCTIONS  - You are felt to be stable enough to no longer require inpatient monitoring, testing, and treatment, though you will need to follow the recommendations below: - Based on the CDC's non-test criteria for ending self-isolation: You may not return to work/leave the home until at least 14 days since symptom onset AND 3 days without a fever (without taking tylenol, ibuprofen, etc.) AND have improvement in respiratory symptoms. - Do not take NSAID medications (including, but not limited to, ibuprofen, advil, motrin, naproxen, aleve, goody's powder, etc.) - Follow up with your doctor in the next week via telehealth or seek medical attention right away if your symptoms get WORSE.  - Consider donating plasma after you have recovered (either 14 days after a negative test or 28 days after symptoms have completely resolved) because your antibodies to this virus may be helpful to give to others with life-threatening infections. Please go to the website www.[email or web address] if you would like to consider volunteering for plasma donation.    Directions for you at home:  Wear a facemask You should wear a facemask that covers your nose and mouth when you are in the same room with other people and when you visit a healthcare provider. People who live with or visit you should  also wear a facemask while they are in the same room with you.  Separate yourself from other people in your home As much as possible, you  should stay in a different room from other people in your home. Also, you should use a separate bathroom, if available.  Avoid sharing household items You should not share dishes, drinking glasses, cups, eating utensils, towels, bedding, or other items with other people in your home. After using these items, you should wash them thoroughly with soap and water.  Cover your coughs and sneezes Cover your mouth and nose with a tissue when you cough or sneeze, or you can cough or sneeze into your sleeve. Throw used tissues in a lined trash can, and immediately wash your hands with soap and water for at least 20 seconds or use an alcohol-based hand rub.  Wash your [employer] your hands often and thoroughly with soap and water for at least 20 seconds. You can use an alcohol-based hand sanitizer if soap and water are not available and if your hands are not visibly dirty. Avoid touching your eyes, nose, and mouth with unwashed hands.  Directions for those who live with, or provide care at home for you:  Limit the number of people who have contact with the patient If possible, have only one caregiver for the patient. Other household members should stay in another home or place of residence. If this is not possible, they should stay in another room, or be separated from the patient as much as possible. Use a separate bathroom, if available. Restrict visitors who do not have an essential need to be in the home.  Ensure good ventilation Make sure that shared spaces in the home have good air flow, such as from an air conditioner or an opened window, weather permitting.  Wash your hands often Wash your hands often and thoroughly with soap and water for at least 20 seconds. You can use an alcohol based hand sanitizer if soap and water are not available and if  your hands are not visibly dirty. Avoid touching your eyes, nose, and mouth with unwashed hands. Use disposable paper towels to dry your hands. If not available, use dedicated cloth towels and replace them when they become wet.  Wear a facemask and gloves Wear a disposable facemask at all times in the room and gloves when you touch or have contact with the patients blood, body fluids, and/or secretions or excretions, such as sweat, saliva, sputum, nasal mucus, vomit, urine, or feces.  Ensure the mask fits over your nose and mouth tightly, and do not touch it during use. Throw out disposable facemasks and gloves after using them. Do not reuse. Wash your hands immediately after removing your facemask and gloves. If your personal clothing becomes contaminated, carefully remove clothing and launder. Wash your hands after handling contaminated clothing. Place all used disposable facemasks, gloves, and other waste in a lined container before disposing them with other household waste. Remove gloves and wash your hands immediately after handling these items.  Do not share dishes, glasses, or other household items with the patient Avoid sharing household items. You should not share dishes, drinking glasses, cups, eating utensils, towels, bedding, or other items with a patient who is confirmed to have, or being evaluated for, COVID-19 infection. After the person uses these items, you should wash them thoroughly with soap and water.  Wash laundry thoroughly Immediately remove and wash clothes or bedding that have blood, body fluids, and/or secretions or excretions, such as sweat, saliva, sputum, nasal mucus, vomit, urine, or feces, on them. Wear gloves when handling laundry from the patient. Read and follow directions on  labels of laundry or clothing items and detergent. In general, wash and dry with the warmest temperatures recommended on the label.  Clean all areas the individual has used often Clean  all touchable surfaces, such as counters, tabletops, doorknobs, bathroom fixtures, toilets, phones, keyboards, tablets, and bedside tables, every day. Also, clean any surfaces that may have blood, body fluids, and/or secretions or excretions on them. Wear gloves when cleaning surfaces the patient has come in contact with. Use a diluted bleach solution (e.g., dilute bleach with 1 part bleach and 10 parts water) or a household disinfectant with a label that says EPA-registered for coronaviruses. To make a bleach solution at home, add 1 tablespoon of bleach to 1 quart (4 cups) of water. For a larger supply, add  cup of bleach to 1 gallon (16 cups) of water. Read labels of cleaning products and follow recommendations provided on product labels. Labels contain instructions for safe and effective use of the cleaning product including precautions you should take when applying the product, such as wearing gloves or eye protection and making sure you have good ventilation during use of the product. Remove gloves and wash hands immediately after cleaning.  Monitor yourself for signs and symptoms of illness Caregivers and household members are considered close contacts, should monitor their health, and will be asked to limit movement outside of the home to the extent possible. Follow the monitoring steps for close contacts listed on the symptom monitoring form.   If you have additional questions, contact your local health department or call the epidemiologist on call at [phone] (available 24/7). This guidance is subject to change. For the most up-to-date guidance from [facility], please refer to their website: TripMetro.hu   You were cared for by a hospitalist during your hospital stay. If you have any questions about your discharge medications or the care you received while you were in the hospital after you are discharged, you can call the unit and  asked to speak with the hospitalist on call if the hospitalist that took care of you is not available. Once you are discharged, your primary care physician will handle any further medical issues. Please note that NO REFILLS for any discharge medications will be authorized once you are discharged, as it is imperative that you return to your primary care physician (or establish a relationship with a primary care physician if you do not have one) for your aftercare needs so that they can reassess your need for medications and monitor your lab values. If you do not have a primary care physician, you can call [phone] for a physician referral.   Increase activity slowly   Complete by:  As directed    [facility] COVID-19 HOME MONITORING PROGRAM   Complete by:  As directed    Is the patient willing to use the MyChart Mobile App for home monitoring?:  Yes   MyChart COVID-19 home monitoring program   Complete by:  Sep 12, 2018    Is the patient willing to use the MyChart Mobile App for home monitoring?:  Yes   Temperature monitoring   Complete by:  Sep 12, 2018    After how many days would you like to receive a notification of this patient's flowsheet entries?:  1        Allergies as of 09/12/2018   No Known Allergies     Medication List    STOP taking these medications   guaiFENesin 600 MG 12 hr tablet Commonly known as:  MUCINEX  naproxen 500 MG tablet Commonly known as:  Naprosyn     TAKE these medications   ascorbic acid 500 MG tablet Commonly known as:  VITAMIN C Take 1 tablet (500 mg total) by mouth daily for 14 days.   benzonatate 200 MG capsule Commonly known as:  TESSALON Take 1 capsule (200 mg total) by mouth 3 (three) times daily. What changed:    when to take this  reasons to take this   famotidine 20 MG tablet Commonly known as:  PEPCID Take 1 tablet (20 mg total) by mouth daily for 14 days.   loratadine 10 MG tablet Commonly known as:  CLARITIN Take 10 mg by mouth  daily.   polyethylene glycol 17 g packet Commonly known as:  MIRALAX / GLYCOLAX Take 17 g by mouth daily as needed for moderate constipation.   predniSONE 20 MG tablet Commonly known as:  DELTASONE Take 1 tablets twice daily for 3 days, then take 1 tablet once daily for 3 days, then stop.   zinc sulfate 220 (50 Zn) MG capsule Take 1 capsule (220 mg total) by mouth daily for 14 days. Start taking on:  Sep 13, 2018        Follow-up Information    Ronnald Nian, MD. Schedule an appointment as soon as possible for a visit in 1 week(s).   Specialty:  Family Medicine Why:  check lft's Contact information: [address] [place] [address] [phone]           TOTAL DISCHARGE TIME: 35 minutes  Joram Venson Rito Ehrlich  [facility] Pager on [email or web address]  09/12/2018, 4:04 PM

## 2018-09-12 NOTE — Progress Notes (Signed)
Pt spoke with his family.  

## 2018-09-12 NOTE — Care Management (Signed)
Case manager will send request to CMA to arrange on Monday 09/14/18  for a Tele Health Visit with patient's provider, Dr. Susann Givens with [facility]. Thankful that he has recovered and is able to discharge home.

## 2018-09-12 NOTE — Progress Notes (Signed)
Patient's sister Dewayne Hatch was updated, given discharge instructions per request of patient.

## 2018-09-12 NOTE — Discharge Instructions (Signed)
Person Under Monitoring Name: Antonio Li  Location: [address] [place] [address]   Infection Prevention Recommendations for Individuals Confirmed to have, or Being Evaluated for, 2019 Novel Coronavirus (COVID-19) Infection Who Receive Care at Home  Individuals who are confirmed to have, or are being evaluated for, COVID-19 should follow the prevention steps below until a healthcare provider or local or state health department says they can return to normal activities.  Stay home except to get medical care You should restrict activities outside your home, except for getting medical care. Do not go to work, school, or public areas, and do not use public transportation or taxis.  Call ahead before visiting your doctor Before your medical appointment, call the healthcare provider and tell them that you have, or are being evaluated for, COVID-19 infection. This will help the healthcare providers office take steps to keep other people from getting infected. Ask your healthcare provider to call the local or state health department.  Monitor your symptoms Seek prompt medical attention if your illness is worsening (e.g., difficulty breathing). Before going to your medical appointment, call the healthcare provider and tell them that you have, or are being evaluated for, COVID-19 infection. Ask your healthcare provider to call the local or state health department.  Wear a facemask You should wear a facemask that covers your nose and mouth when you are in the same room with other people and when you visit a healthcare provider. People who live with or visit you should also wear a facemask while they are in the same room with you.  Separate yourself from other people in your home As much as possible, you should stay in a different room from other people in your home. Also, you should use a separate bathroom, if available.  Avoid sharing household items You should not  share dishes, drinking glasses, cups, eating utensils, towels, bedding, or other items with other people in your home. After using these items, you should wash them thoroughly with soap and water.  Cover your coughs and sneezes Cover your mouth and nose with a tissue when you cough or sneeze, or you can cough or sneeze into your sleeve. Throw used tissues in a lined trash can, and immediately wash your hands with soap and water for at least 20 seconds or use an alcohol-based hand rub.  Wash your [employer] your hands often and thoroughly with soap and water for at least 20 seconds. You can use an alcohol-based hand sanitizer if soap and water are not available and if your hands are not visibly dirty. Avoid touching your eyes, nose, and mouth with unwashed hands.   Prevention Steps for Caregivers and Household Members of Individuals Confirmed to have, or Being Evaluated for, COVID-19 Infection Being Cared for in the Home  If you live with, or provide care at home for, a person confirmed to have, or being evaluated for, COVID-19 infection please follow these guidelines to prevent infection:  Follow healthcare providers instructions Make sure that you understand and can help the patient follow any healthcare provider instructions for all care.  Provide for the patients basic needs You should help the patient with basic needs in the home and provide support for getting groceries, prescriptions, and other personal needs.  Monitor the patients symptoms If they are getting sicker, call his or her medical provider and tell them that the patient has, or is being evaluated for, COVID-19 infection. This will help the healthcare providers office  take steps to keep other people from getting infected. Ask the healthcare provider to call the local or state health department.  Limit the number of people who have contact with the patient  If possible, have only one caregiver for the  patient.  Other household members should stay in another home or place of residence. If this is not possible, they should stay  in another room, or be separated from the patient as much as possible. Use a separate bathroom, if available.  Restrict visitors who do not have an essential need to be in the home.  Keep older adults, very young children, and other sick people away from the patient Keep older adults, very young children, and those who have compromised immune systems or chronic health conditions away from the patient. This includes people with chronic heart, lung, or kidney conditions, diabetes, and cancer.  Ensure good ventilation Make sure that shared spaces in the home have good air flow, such as from an air conditioner or an opened window, weather permitting.  Wash your hands often  Wash your hands often and thoroughly with soap and water for at least 20 seconds. You can use an alcohol based hand sanitizer if soap and water are not available and if your hands are not visibly dirty.  Avoid touching your eyes, nose, and mouth with unwashed hands.  Use disposable paper towels to dry your hands. If not available, use dedicated cloth towels and replace them when they become wet.  Wear a facemask and gloves  Wear a disposable facemask at all times in the room and gloves when you touch or have contact with the patients blood, body fluids, and/or secretions or excretions, such as sweat, saliva, sputum, nasal mucus, vomit, urine, or feces.  Ensure the mask fits over your nose and mouth tightly, and do not touch it during use.  Throw out disposable facemasks and gloves after using them. Do not reuse.  Wash your hands immediately after removing your facemask and gloves.  If your personal clothing becomes contaminated, carefully remove clothing and launder. Wash your hands after handling contaminated clothing.  Place all used disposable facemasks, gloves, and other waste in a lined  container before disposing them with other household waste.  Remove gloves and wash your hands immediately after handling these items.  Do not share dishes, glasses, or other household items with the patient  Avoid sharing household items. You should not share dishes, drinking glasses, cups, eating utensils, towels, bedding, or other items with a patient who is confirmed to have, or being evaluated for, COVID-19 infection.  After the person uses these items, you should wash them thoroughly with soap and water.  Wash laundry thoroughly  Immediately remove and wash clothes or bedding that have blood, body fluids, and/or secretions or excretions, such as sweat, saliva, sputum, nasal mucus, vomit, urine, or feces, on them.  Wear gloves when handling laundry from the patient.  Read and follow directions on labels of laundry or clothing items and detergent. In general, wash and dry with the warmest temperatures recommended on the label.  Clean all areas the individual has used often  Clean all touchable surfaces, such as counters, tabletops, doorknobs, bathroom fixtures, toilets, phones, keyboards, tablets, and bedside tables, every day. Also, clean any surfaces that may have blood, body fluids, and/or secretions or excretions on them.  Wear gloves when cleaning surfaces the patient has come in contact with.  Use a diluted bleach solution (e.g., dilute bleach with 1 part  bleach and 10 parts water) or a household disinfectant with a label that says EPA-registered for coronaviruses. To make a bleach solution at home, add 1 tablespoon of bleach to 1 quart (4 cups) of water. For a larger supply, add  cup of bleach to 1 gallon (16 cups) of water.  Read labels of cleaning products and follow recommendations provided on product labels. Labels contain instructions for safe and effective use of the cleaning product including precautions you should take when applying the product, such as wearing gloves or  eye protection and making sure you have good ventilation during use of the product.  Remove gloves and wash hands immediately after cleaning.  Monitor yourself for signs and symptoms of illness Caregivers and household members are considered close contacts, should monitor their health, and will be asked to limit movement outside of the home to the extent possible. Follow the monitoring steps for close contacts listed on the symptom monitoring form.   ? If you have additional questions, contact your local health department or call the epidemiologist on call at [phone] (available 24/7). ? This guidance is subject to change. For the most up-to-date guidance from [facility], please refer to their website: [email or web address]

## 2018-09-15 ENCOUNTER — Telehealth (INDEPENDENT_AMBULATORY_CARE_PROVIDER_SITE_OTHER): Payer: Self-pay | Admitting: Family Medicine

## 2018-09-15 LAB — HIV ANTIBODY (ROUTINE TESTING W REFLEX): HIV Screen 4th Generation wRfx: NONREACTIVE

## 2018-09-16 ENCOUNTER — Telehealth: Payer: Self-pay | Admitting: Family Medicine

## 2018-09-16 NOTE — Telephone Encounter (Signed)
Keep working on getting the information from the hospital on what if any follow-up they have since he left the hospital

## 2018-09-16 NOTE — Telephone Encounter (Signed)
6/2 Sign Language, no cell #, no email, no mychart

## 2018-09-16 NOTE — Telephone Encounter (Signed)
Called pt t/w Nuang his sister.  She is on his HIPAA.  She states he is in his room asleep.  He is doing fine. No fever, no body aches, still has cough.  He has had the cough for a while.  She will be there tomorrow for the virtual visit.  She states he cannot hear, he can read lips.  She will be there for the visit.

## 2018-09-17 ENCOUNTER — Other Ambulatory Visit: Payer: Medicaid Other

## 2018-09-17 ENCOUNTER — Telehealth: Payer: Self-pay

## 2018-09-17 ENCOUNTER — Other Ambulatory Visit: Payer: Self-pay

## 2018-09-17 ENCOUNTER — Ambulatory Visit (INDEPENDENT_AMBULATORY_CARE_PROVIDER_SITE_OTHER): Payer: Medicaid Other | Admitting: Family Medicine

## 2018-09-17 DIAGNOSIS — U071 COVID-19: Secondary | ICD-10-CM | POA: Diagnosis not present

## 2018-09-17 DIAGNOSIS — J1289 Other viral pneumonia: Secondary | ICD-10-CM

## 2018-09-17 DIAGNOSIS — J1282 Pneumonia due to coronavirus disease 2019: Secondary | ICD-10-CM

## 2018-09-17 DIAGNOSIS — Z20822 Contact with and (suspected) exposure to covid-19: Secondary | ICD-10-CM

## 2018-09-17 DIAGNOSIS — R6889 Other general symptoms and signs: Secondary | ICD-10-CM | POA: Diagnosis not present

## 2018-09-17 NOTE — Telephone Encounter (Signed)
Patient's sister Dewayne Hatch returned the call, advised of the request to schedule the patient for covid testing. Appointment scheduled for today at 1415 at [facility], advised of location and to wear a mask for everyone in the vehicle, she verbalized understanding. Order placed.

## 2018-09-17 NOTE — Addendum Note (Signed)
Addended by: Wilford Corner on: 09/17/2018 01:19 PM   Modules accepted: Orders

## 2018-09-17 NOTE — Progress Notes (Signed)
   Subjective:    Patient ID: Antonio Li, male    DOB: [birth year], 28 y.o.   MRN: [id number]  HPI Documentation for virtual telephone encounter.    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 The provider was located in the office. The patient did consent to this visit and is aware of possible charges through their insurance for this visit.  The other persons participating in this telemedicine service was his sister,Ann Time spent on call was 10 minutes This virtual service is not related to other E/M service within previous 7 days. He was admitted to the hospital on May 24 and sent home on May 30 and treated for COVID-19 having difficulty with acute respiratory distress.  Also had difficulty with transaminitis as well as hyponatremia. He has been home however has had no contact from any hospital or [place] health personnel since leaving the hospital.  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  Conversation to his sister who was texting him in another room as he is in isolation from all of his family indicates that he is still having a cough, some dizziness and headache but no fever.  Apparently no troubles with smell or taste or shortness of breath.    Review of Systems     Objective:   Physical Exam Patient not examined. Medical record was reviewed.     Assessment & Plan:  Pneumonia due to COVID-19 virus Order was placed through [facility] for further testing.  I will wait for the results to come in before any blood work.

## 2018-09-17 NOTE — Telephone Encounter (Addendum)
Patient's sister Antonio Li called at number listed below, left VM to return call to [phone] between 0700-1900 Monday through Friday to schedule testing.   ----- Message from Ronnald Nian, MD sent at 09/17/2018 12:48 PM EDT ----- He is COVID positive and was hospitalized.  He has been home for several days but still having some symptoms.  Please retest  Ronnald Nian, MD  [facility]        I just sent a message concerning this patient. He is deaf and you will need to work through his sister, Antonio Li [phone]

## 2018-09-19 LAB — NOVEL CORONAVIRUS, NAA: SARS-CoV-2, NAA: NOT DETECTED

## 2018-09-21 NOTE — Telephone Encounter (Signed)
Pt coming in Wed for labs

## 2018-09-22 ENCOUNTER — Ambulatory Visit (INDEPENDENT_AMBULATORY_CARE_PROVIDER_SITE_OTHER): Payer: Medicaid Other | Admitting: Family Medicine

## 2018-09-22 ENCOUNTER — Encounter: Payer: Self-pay | Admitting: Family Medicine

## 2018-09-22 ENCOUNTER — Other Ambulatory Visit: Payer: Self-pay

## 2018-09-22 VITALS — BP 124/82 | HR 100 | Temp 98.4°F | Wt 176.2 lb

## 2018-09-22 DIAGNOSIS — J1289 Other viral pneumonia: Secondary | ICD-10-CM

## 2018-09-22 DIAGNOSIS — U071 COVID-19: Secondary | ICD-10-CM

## 2018-09-22 DIAGNOSIS — R740 Nonspecific elevation of levels of transaminase and lactic acid dehydrogenase [LDH]: Secondary | ICD-10-CM | POA: Diagnosis not present

## 2018-09-22 DIAGNOSIS — R7401 Elevation of levels of liver transaminase levels: Secondary | ICD-10-CM

## 2018-09-22 MED ORDER — ALBUTEROL SULFATE HFA 108 (90 BASE) MCG/ACT IN AERS: 2.0000 | INHALATION_SPRAY | Freq: Four times a day (QID) | RESPIRATORY_TRACT | 2 refills | Status: DC | PRN

## 2018-09-22 NOTE — Progress Notes (Signed)
   Subjective:    Patient ID: Antonio Li, male    DOB: 01-12-1991, 28 y.o.   MRN: [id number]  HPI He is here for a recheck after recent hospitalization and treatment for COVID.  The hospital record was reviewed.  He did have evidence of pneumonia as well as transaminitis.  Presently he does complain of a cough but mainly with physical activity.  He also complains of some vague stomach discomfort.  Also complains of headache and a heavy feeling in his eyes but no fever, chills, sore throat, earache. He was not on my chart and therefore we called him several times after he was discharged to follow-up with him. He did have a follow-up COVID test which was negative.  Review of Systems     Objective:   Physical Exam Alert and in no distress.  Throat is clear.  Neck is supple without adenopathy cardiac exam shows regular rhythm without murmurs or gallops.  Lungs are clear to auscultation. The medical record including the emergency room, discharge summary and lab work was evaluated.      Assessment & Plan:  Pneumonia due to COVID-19 virus - Plan: Lactate dehydrogenase, C-reactive protein, Comprehensive metabolic panel, albuterol (VENTOLIN HFA) 108 (90 Base) MCG/ACT inhaler  Transaminitis - Plan: Comprehensive metabolic panel Try Tylenol for the aches and pains.  Also try Prilosec for the stomach.  Try the inhaler and let me know how it works.

## 2018-09-22 NOTE — Patient Instructions (Addendum)
Try Tylenol for the aches and pains.  Also try Prilosec for the stomach.  Try the inhaler and let me know how it works.

## 2018-09-23 ENCOUNTER — Other Ambulatory Visit: Payer: Medicaid Other

## 2018-09-23 ENCOUNTER — Encounter: Payer: Self-pay | Admitting: Family Medicine

## 2018-09-23 ENCOUNTER — Encounter (INDEPENDENT_AMBULATORY_CARE_PROVIDER_SITE_OTHER): Payer: Self-pay

## 2018-09-23 LAB — LACTATE DEHYDROGENASE: LDH: 196 IU/L (ref 121–224)

## 2018-09-23 LAB — COMPREHENSIVE METABOLIC PANEL
ALT: 202 IU/L — ABNORMAL HIGH (ref 0–44)
AST: 57 IU/L — ABNORMAL HIGH (ref 0–40)
Albumin/Globulin Ratio: 1.3 (ref 1.2–2.2)
Albumin: 4.1 g/dL (ref 4.1–5.2)
Alkaline Phosphatase: 108 IU/L (ref 39–117)
BUN/Creatinine Ratio: 18 (ref 9–20)
BUN: 20 mg/dL (ref 6–20)
Bilirubin Total: 0.4 mg/dL (ref 0.0–1.2)
CO2: 19 mmol/L — ABNORMAL LOW (ref 20–29)
Calcium: 9.1 mg/dL (ref 8.7–10.2)
Chloride: 104 mmol/L (ref 96–106)
Creatinine, Ser: 1.13 mg/dL (ref 0.76–1.27)
GFR calc Af Amer: 100 mL/min/{1.73_m2} (ref >59)
GFR calc non Af Amer: 87 mL/min/{1.73_m2} (ref >59)
Globulin, Total: 3.2 g/dL (ref 1.5–4.5)
Glucose: 104 mg/dL — ABNORMAL HIGH (ref 65–99)
Potassium: 4.1 mmol/L (ref 3.5–5.2)
Sodium: 138 mmol/L (ref 134–144)
Total Protein: 7.3 g/dL (ref 6.0–8.5)

## 2018-09-23 LAB — C-REACTIVE PROTEIN: CRP: 2 mg/L (ref 0–10)

## 2018-09-24 ENCOUNTER — Encounter (INDEPENDENT_AMBULATORY_CARE_PROVIDER_SITE_OTHER): Payer: Self-pay

## 2018-09-25 ENCOUNTER — Encounter (INDEPENDENT_AMBULATORY_CARE_PROVIDER_SITE_OTHER): Payer: Self-pay

## 2018-11-19 ENCOUNTER — Encounter: Payer: Self-pay | Admitting: Family Medicine

## 2018-11-23 ENCOUNTER — Other Ambulatory Visit: Payer: Self-pay

## 2018-11-23 ENCOUNTER — Ambulatory Visit (INDEPENDENT_AMBULATORY_CARE_PROVIDER_SITE_OTHER): Payer: Medicaid Other | Admitting: Family Medicine

## 2018-11-23 ENCOUNTER — Encounter: Payer: Self-pay | Admitting: Family Medicine

## 2018-11-23 VITALS — BP 116/78 | HR 72 | Temp 99.6°F | Wt 182.8 lb

## 2018-11-23 DIAGNOSIS — R079 Chest pain, unspecified: Secondary | ICD-10-CM

## 2018-11-23 DIAGNOSIS — I440 Atrioventricular block, first degree: Secondary | ICD-10-CM | POA: Insufficient documentation

## 2018-11-23 NOTE — Addendum Note (Signed)
Addended by: Denita Lung on: 11/23/2018 03:52 PM   Modules accepted: Orders

## 2018-11-23 NOTE — Progress Notes (Addendum)
   Subjective:    Patient ID: Antonio Li, male    DOB: 30-Aug-1990, 30 y.o.   MRN: [id number]  HPI He is here for consult concerning possible heartburn.  He has been using Nexium over-the-counter with no results from this.  He describes now chest discomfort and rapid heart rate but has not checked his heart rate.  It can occur at any time and is not associated with physical activity, shortness of breath, PND.  No associated nausea, vomiting, effective eating.   Review of Systems     Objective:   Physical Exam Alert and in no distress.  Liquid Maalox given with no results.  EKG does show first-degree heart block.      Assessment & Plan:   Encounter Diagnosis  Name Primary?  . Chest pain, unspecified type Yes  Very difficult to determine exactly what is going on here.  I taught him how to check his pulse rate and he will keep track of the rapid heart rate and his symptoms of chest discomfort over the next several days and let me know.

## 2019-01-28 ENCOUNTER — Encounter: Payer: Self-pay | Admitting: Family Medicine

## 2019-06-21 ENCOUNTER — Encounter: Payer: Self-pay | Admitting: Family Medicine

## 2019-06-25 ENCOUNTER — Encounter: Payer: Self-pay | Admitting: Family Medicine

## 2019-06-25 MED ORDER — AMOXICILLIN 875 MG PO TABS: 875.0000 mg | ORAL_TABLET | Freq: Two times a day (BID) | ORAL | 0 refills | Status: DC

## 2019-06-29 ENCOUNTER — Encounter: Payer: Self-pay | Admitting: Family Medicine

## 2019-07-19 ENCOUNTER — Encounter: Payer: Self-pay | Admitting: Family Medicine

## 2019-07-20 ENCOUNTER — Other Ambulatory Visit: Payer: Self-pay

## 2019-07-20 ENCOUNTER — Ambulatory Visit: Payer: Medicaid Other | Admitting: Family Medicine

## 2019-07-20 VITALS — BP 112/82 | HR 87 | Temp 96.4°F | Wt 182.6 lb

## 2019-07-20 DIAGNOSIS — R1084 Generalized abdominal pain: Secondary | ICD-10-CM | POA: Diagnosis not present

## 2019-07-20 DIAGNOSIS — H538 Other visual disturbances: Secondary | ICD-10-CM | POA: Diagnosis not present

## 2019-07-20 DIAGNOSIS — H53132 Sudden visual loss, left eye: Secondary | ICD-10-CM | POA: Diagnosis not present

## 2019-07-20 DIAGNOSIS — J0100 Acute maxillary sinusitis, unspecified: Secondary | ICD-10-CM

## 2019-07-20 DIAGNOSIS — R200 Anesthesia of skin: Secondary | ICD-10-CM | POA: Diagnosis not present

## 2019-07-20 DIAGNOSIS — H02846 Edema of left eye, unspecified eyelid: Secondary | ICD-10-CM | POA: Diagnosis not present

## 2019-07-20 DIAGNOSIS — H52512 Internal ophthalmoplegia (complete) (total), left eye: Secondary | ICD-10-CM | POA: Diagnosis not present

## 2019-07-20 DIAGNOSIS — R918 Other nonspecific abnormal finding of lung field: Secondary | ICD-10-CM | POA: Diagnosis not present

## 2019-07-20 DIAGNOSIS — H53139 Sudden visual loss, unspecified eye: Secondary | ICD-10-CM | POA: Diagnosis not present

## 2019-07-20 DIAGNOSIS — H5712 Ocular pain, left eye: Secondary | ICD-10-CM | POA: Diagnosis not present

## 2019-07-20 DIAGNOSIS — Z20822 Contact with and (suspected) exposure to covid-19: Secondary | ICD-10-CM | POA: Diagnosis not present

## 2019-07-20 DIAGNOSIS — Z5181 Encounter for therapeutic drug level monitoring: Secondary | ICD-10-CM | POA: Diagnosis not present

## 2019-07-20 DIAGNOSIS — H02402 Unspecified ptosis of left eyelid: Secondary | ICD-10-CM | POA: Diagnosis not present

## 2019-07-20 DIAGNOSIS — H05019 Cellulitis of unspecified orbit: Secondary | ICD-10-CM | POA: Diagnosis not present

## 2019-07-20 DIAGNOSIS — R519 Headache, unspecified: Secondary | ICD-10-CM | POA: Diagnosis not present

## 2019-07-20 MED ORDER — AMOXICILLIN-POT CLAVULANATE 875-125 MG PO TABS: 1.0000 | ORAL_TABLET | Freq: Two times a day (BID) | ORAL | 0 refills | Status: DC

## 2019-07-20 NOTE — Progress Notes (Addendum)
   Subjective:    Patient ID: Antonio Li, male    DOB: [birth year], 29 y.o.   MRN: [id number]  HPI He states that he has been having left-sided facial pain for the last month and visual changes for the last week.  He states that he cannot drive because he cannot see out of his left eye.  On March 23 he did have wisdom teeth removed in the upper left which did eliminate the tooth pain but he is continue to have pain in the eye and maxillary sinus area.  No fever, chills, PND.   Review of Systems     Objective:   Physical Exam Alert and in no distress. Tympanic membranes and canals are normal. Pharyngeal area is normal. Neck is supple without adenopathy or thyromegaly. Cardiac exam shows a regular sinus rhythm without murmurs or gallops. Lungs are clear to auscultation.PERRL.  Visual field check did show  decreased vision in all directions.  Vision field check showed worse than 20/70.       Assessment & Plan:  Acute maxillary sinusitis, recurrence not specified - Plan: amoxicillin-clavulanate (AUGMENTIN) 875-125 MG tablet  Blurred vision, left eye Some of his symptoms could be sinusitis however the blurred vision is quite bothersome and I will get him into see ophthalmology today. I explained that I wanted him to hold the antibiotic until after the ophthalmologist sees him.

## 2019-07-20 NOTE — Patient Instructions (Signed)
Stop that Pen-VK and switch to my antibiotic.  Take the ibuprofen for pain and hold off on taking the codeine Take all the new antibiotic and if not totally back to normal, call me

## 2019-07-21 DIAGNOSIS — H5712 Ocular pain, left eye: Secondary | ICD-10-CM | POA: Diagnosis not present

## 2019-07-21 DIAGNOSIS — I676 Nonpyogenic thrombosis of intracranial venous system: Secondary | ICD-10-CM | POA: Diagnosis not present

## 2019-07-21 DIAGNOSIS — H903 Sensorineural hearing loss, bilateral: Secondary | ICD-10-CM | POA: Diagnosis not present

## 2019-07-21 DIAGNOSIS — R918 Other nonspecific abnormal finding of lung field: Secondary | ICD-10-CM | POA: Diagnosis not present

## 2019-07-21 DIAGNOSIS — K08409 Partial loss of teeth, unspecified cause, unspecified class: Secondary | ICD-10-CM | POA: Diagnosis not present

## 2019-07-21 MED ORDER — ENOXAPARIN SODIUM 40 MG/0.4ML ~~LOC~~ SOLN
40.00 | SUBCUTANEOUS | Status: DC
Start: 2019-07-27 — End: 2019-07-21

## 2019-07-21 MED ORDER — POLYETHYLENE GLYCOL 3350 17 GM/SCOOP PO POWD
17.00 | ORAL | Status: DC
Start: ? — End: 2019-07-21

## 2019-07-21 MED ORDER — LACTATED RINGERS IV SOLN
INTRAVENOUS | Status: DC
Start: ? — End: 2019-07-21

## 2019-07-21 MED ORDER — LIDOCAINE HCL 1 % IJ SOLN
0.50 | INTRAMUSCULAR | Status: DC
Start: ? — End: 2019-07-21

## 2019-07-21 MED ORDER — ACETAMINOPHEN 325 MG PO TABS
650.00 | ORAL_TABLET | ORAL | Status: DC
Start: ? — End: 2019-07-21

## 2019-07-22 DIAGNOSIS — H5712 Ocular pain, left eye: Secondary | ICD-10-CM | POA: Diagnosis not present

## 2019-07-22 DIAGNOSIS — K08409 Partial loss of teeth, unspecified cause, unspecified class: Secondary | ICD-10-CM | POA: Diagnosis not present

## 2019-07-22 DIAGNOSIS — G509 Disorder of trigeminal nerve, unspecified: Secondary | ICD-10-CM | POA: Diagnosis not present

## 2019-07-22 DIAGNOSIS — H903 Sensorineural hearing loss, bilateral: Secondary | ICD-10-CM | POA: Diagnosis not present

## 2019-07-22 DIAGNOSIS — I676 Nonpyogenic thrombosis of intracranial venous system: Secondary | ICD-10-CM | POA: Diagnosis not present

## 2019-07-22 DIAGNOSIS — H4902 Third [oculomotor] nerve palsy, left eye: Secondary | ICD-10-CM | POA: Diagnosis not present

## 2019-07-22 MED ORDER — OXYCODONE HCL 5 MG PO TABS
5.00 | ORAL_TABLET | ORAL | Status: DC
Start: ? — End: 2019-07-22

## 2019-07-22 MED ORDER — GENERIC EXTERNAL MEDICATION
1000.00 | Status: DC
Start: 2019-07-23 — End: 2019-07-22

## 2019-07-22 MED ORDER — DEXTROSE 50 % IV SOLN
12.50 | INTRAVENOUS | Status: DC
Start: ? — End: 2019-07-22

## 2019-07-22 MED ORDER — PANTOPRAZOLE SODIUM 40 MG PO TBEC
40.00 | DELAYED_RELEASE_TABLET | ORAL | Status: DC
Start: 2019-07-27 — End: 2019-07-22

## 2019-07-22 MED ORDER — MELATONIN 3 MG PO TABS
3.00 | ORAL_TABLET | ORAL | Status: DC
Start: 2019-07-26 — End: 2019-07-22

## 2019-07-22 MED ORDER — GLUCAGON (RDNA) 1 MG IJ KIT
1.00 | PACK | INTRAMUSCULAR | Status: DC
Start: ? — End: 2019-07-22

## 2019-07-22 MED ORDER — INSULIN LISPRO 100 UNIT/ML ~~LOC~~ SOLN
0.00 | SUBCUTANEOUS | Status: DC
Start: 2019-07-26 — End: 2019-07-22

## 2019-07-23 DIAGNOSIS — H903 Sensorineural hearing loss, bilateral: Secondary | ICD-10-CM | POA: Diagnosis not present

## 2019-07-23 DIAGNOSIS — I676 Nonpyogenic thrombosis of intracranial venous system: Secondary | ICD-10-CM | POA: Diagnosis not present

## 2019-07-23 DIAGNOSIS — K08409 Partial loss of teeth, unspecified cause, unspecified class: Secondary | ICD-10-CM | POA: Diagnosis not present

## 2019-07-23 DIAGNOSIS — H5712 Ocular pain, left eye: Secondary | ICD-10-CM | POA: Diagnosis not present

## 2019-07-24 DIAGNOSIS — H903 Sensorineural hearing loss, bilateral: Secondary | ICD-10-CM | POA: Diagnosis not present

## 2019-07-24 DIAGNOSIS — H5712 Ocular pain, left eye: Secondary | ICD-10-CM | POA: Diagnosis not present

## 2019-07-24 DIAGNOSIS — I676 Nonpyogenic thrombosis of intracranial venous system: Secondary | ICD-10-CM | POA: Diagnosis not present

## 2019-07-24 DIAGNOSIS — K08409 Partial loss of teeth, unspecified cause, unspecified class: Secondary | ICD-10-CM | POA: Diagnosis not present

## 2019-07-24 DIAGNOSIS — Z227 Latent tuberculosis: Secondary | ICD-10-CM | POA: Diagnosis not present

## 2019-07-25 DIAGNOSIS — H5712 Ocular pain, left eye: Secondary | ICD-10-CM | POA: Diagnosis not present

## 2019-07-25 DIAGNOSIS — H903 Sensorineural hearing loss, bilateral: Secondary | ICD-10-CM | POA: Diagnosis not present

## 2019-07-25 DIAGNOSIS — K08409 Partial loss of teeth, unspecified cause, unspecified class: Secondary | ICD-10-CM | POA: Diagnosis not present

## 2019-07-25 DIAGNOSIS — I676 Nonpyogenic thrombosis of intracranial venous system: Secondary | ICD-10-CM | POA: Diagnosis not present

## 2019-07-26 DIAGNOSIS — I676 Nonpyogenic thrombosis of intracranial venous system: Secondary | ICD-10-CM | POA: Diagnosis not present

## 2019-07-26 DIAGNOSIS — H5712 Ocular pain, left eye: Secondary | ICD-10-CM | POA: Diagnosis not present

## 2019-07-26 DIAGNOSIS — H903 Sensorineural hearing loss, bilateral: Secondary | ICD-10-CM | POA: Diagnosis not present

## 2019-07-26 DIAGNOSIS — K08409 Partial loss of teeth, unspecified cause, unspecified class: Secondary | ICD-10-CM | POA: Diagnosis not present

## 2019-07-26 MED ORDER — TRAZODONE HCL 50 MG PO TABS
25.00 | ORAL_TABLET | ORAL | Status: DC
Start: ? — End: 2019-07-26

## 2019-07-26 MED ORDER — GENERIC EXTERNAL MEDICATION
1000.00 | Status: DC
Start: 2019-07-26 — End: 2019-07-26

## 2019-08-02 DIAGNOSIS — H52512 Internal ophthalmoplegia (complete) (total), left eye: Secondary | ICD-10-CM | POA: Diagnosis not present

## 2019-08-11 ENCOUNTER — Encounter: Payer: Self-pay | Admitting: Family Medicine

## 2019-08-17 ENCOUNTER — Telehealth: Payer: Self-pay | Admitting: Medical

## 2019-08-17 ENCOUNTER — Ambulatory Visit (INDEPENDENT_AMBULATORY_CARE_PROVIDER_SITE_OTHER): Payer: Medicaid Other | Admitting: Medical

## 2019-08-17 ENCOUNTER — Encounter: Payer: Self-pay | Admitting: Medical

## 2019-08-17 VITALS — BP 122/88 | HR 121 | Temp 98.6°F | Ht 65.0 in | Wt 184.0 lb

## 2019-08-17 DIAGNOSIS — K921 Melena: Secondary | ICD-10-CM

## 2019-08-17 DIAGNOSIS — R251 Tremor, unspecified: Secondary | ICD-10-CM

## 2019-08-17 DIAGNOSIS — R7612 Nonspecific reaction to cell mediated immunity measurement of gamma interferon antigen response without active tuberculosis: Secondary | ICD-10-CM | POA: Diagnosis not present

## 2019-08-17 DIAGNOSIS — Z79899 Other long term (current) drug therapy: Secondary | ICD-10-CM

## 2019-08-17 DIAGNOSIS — Z9621 Cochlear implant status: Secondary | ICD-10-CM

## 2019-08-17 DIAGNOSIS — Z789 Other specified health status: Secondary | ICD-10-CM

## 2019-08-17 DIAGNOSIS — R21 Rash and other nonspecific skin eruption: Secondary | ICD-10-CM

## 2019-08-17 DIAGNOSIS — G8929 Other chronic pain: Secondary | ICD-10-CM | POA: Diagnosis not present

## 2019-08-17 DIAGNOSIS — Z23 Encounter for immunization: Secondary | ICD-10-CM | POA: Insufficient documentation

## 2019-08-17 DIAGNOSIS — K089 Disorder of teeth and supporting structures, unspecified: Secondary | ICD-10-CM | POA: Diagnosis not present

## 2019-08-17 LAB — POCT HEMOGLOBIN: Hemoglobin: 13.7 g/dL (ref 11–14.6)

## 2019-08-17 NOTE — Patient Instructions (Addendum)
Blood in stool  This could be aggravated by the prednisone, but for now continue current prednisone  Could be related to internal hemorrhoids or other process in the gut  AVOID medications such as ibuprofen, Aleve, Advil, Motrin, Naprosyn, or prescription anti-inflammatories which are used for pain, inflammation, and arthritis.     AVOID alcohol for now  Plan to return tomorrow for repeat hemoglobin and orthostatic vital signs to see if the blood in stool is more significant that it seems   Rash Let me review your records more in depth from [facility]  Not sure if this is a drug reaction or other problem    Lets hold off on hepatitis B vaccine until the other issues seem under control

## 2019-08-17 NOTE — Telephone Encounter (Signed)
He needs to be on schedule tomorrow for followup!    30 minutes

## 2019-08-17 NOTE — Progress Notes (Signed)
Subjective:  Antonio Li is a 29 y.o. male who presents for Chief Complaint  Patient presents with  . Blood In Stools     Here today for acute visit for blood in the stool. He normally sees Dr. Susann Givens here.  He notes blood in the stool periodically for the last year plus but has been more frequent of late.  He is also been having some left-sided abdominal discomfort from time to time.  He seems to note discomfort when he sees the blood in the stool but otherwise no pain.  He also says the blood is not daily.  He had blood in the stool last week 1 day and then yesterday.  The blood is bright red and is on the toilet paper and in the bowl.  He feels nervous or shaky.  No prior GI consult.  He notes some alcohol on the weekends maybe 2 beers at a time.  He denies NSAID use.  He recently had a hospitalization at [facility] related to his eye issue.  He has an upcoming appointment with neurology and ophthalmology related to his recent hospital visit.  He has ongoing problems with left upper incisor that sends pain up his face.  This has been going on a while.  He was advised to get additional hepatitis B vaccine on follow-up.  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  He is status post cochlear implant.  He notes bumpy rash on torso front and back and right hip that stared 1.5 days after hospital discharge recently.    He has seen health dept since hospital;aiztion for + quantiferon and recently stared on medications.  No other aggravating or relieving factors.    No other c/o.  The following portions of the patient's history were reviewed and updated as appropriate: allergies, current medications, past family history, past medical history, past social history, past surgical history and problem list.  ROS Otherwise as in subjective above  Past Medical History:  Diagnosis Date  . Cochlear implant in place   . Deafness    Current Outpatient Medications  on File Prior to Visit  Medication Sig Dispense Refill  . pantoprazole (PROTONIX) 40 MG tablet Take by mouth.    . predniSONE (DELTASONE) 20 MG tablet Take 1 tablets twice daily for 3 days, then take 1 tablet once daily for 3 days, then stop. 9 tablet 0  . sulfamethoxazole-trimethoprim (BACTRIM) 400-80 MG tablet Take 1 tablet by mouth daily.    Marland Kitchen acetaminophen (TYLENOL) 500 MG tablet Take 500 mg by mouth every 6 (six) hours as needed.    Marland Kitchen albuterol (VENTOLIN HFA) 108 (90 Base) MCG/ACT inhaler Inhale 2 puffs into the lungs every 6 (six) hours as needed for wheezing or shortness of breath. (Patient not taking: Reported on 11/23/2018) 1 Inhaler 2  . amoxicillin (AMOXIL) 875 MG tablet Take 1 tablet (875 mg total) by mouth 2 (two) times daily. (Patient not taking: Reported on 08/17/2019) 20 tablet 0  . amoxicillin-clavulanate (AUGMENTIN) 875-125 MG tablet Take 1 tablet by mouth 2 (two) times daily. (Patient not taking: Reported on 08/17/2019) 20 tablet 0  . benzonatate (TESSALON) 200 MG capsule Take 1 capsule (200 mg total) by mouth 3 (three) times daily. (Patient not taking: Reported on 11/23/2018) 30 capsule 0  . esomeprazole (NEXIUM) 20 MG capsule Take 20 mg by mouth daily at 12 noon.    . famotidine (PEPCID) 20 MG tablet Take 1 tablet (20 mg total)  by mouth daily for 14 days. 14 tablet 0  . loratadine (CLARITIN) 10 MG tablet Take 10 mg by mouth daily.    . polyethylene glycol (MIRALAX / GLYCOLAX) 17 g packet Take 17 g by mouth daily as needed for moderate constipation. (Patient not taking: Reported on 11/23/2018) 14 each 0   No current facility-administered medications on file prior to visit.       Objective: BP 122/88   Pulse (!) 121   Temp 98.6 F (37 C)   Ht 5\' 5"  (1.651 m)   Wt 184 lb (83.5 kg)   SpO2 95%   BMI 30.62 kg/m    General appearance: alert,  well developed, well nourished, seems a little anxious.  Patch over left eye, he has a cochlear implant electronic device around his neck  on the necklace Skin: scattered papular erytehamouts 2-93mm diamater papular rash across anterior and posterior torso, but no lower legs or arms or face Face normocephalic, tender over left upper incisor and he note pain through left face although nontender to palpation Oral cavity: MMM, no lesions Neck: supple, no lymphadenopathy, no thyromegaly, no masses Heart: Tachycardic about 112 bpm, RRR, normal S1, S2, no murmurs Lungs: CTA bilaterally, no wheezes, rhonchi, or rales Abdomen: + Somewhat increased bs, soft, non tender, non distended, no masses, no hepatomegaly, no splenomegaly Pulses: 2+ radial pulses, 2+ pedal pulses, normal cap refill Ext: no edema Psych: Somewhat nervous affect, otherwise answers questions appropriate, Rectal: somewhat thickened tissue anterior anus but no obvious external hemorrhoid, nontender exam, normal tone, +guaiac stool     Assessment: Encounter Diagnoses  Name Primary?  . Blood in stool Yes  . Shakiness   . Cochlear implant in place   . Chronic dental pain   . Rash   . High risk medication use   . Need for hepatitis B booster vaccination   . 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   Initially responded well to IV steroids.   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  I reviewed labs in care everywhere.  He had a CBC on July 26, 2019 showing elevated white cells at 14,400, hemoglobin was 15.3 normal,  hematocrit 46.6 normal, platelets normal, neutrophils elevated.  He had elevated granulocytes.  Blood sugar on 07/26/2019 was 168, not sure if fasting or not.  Comprehensive metabolic panel on 07/26/2019 showed glucose 137, calcium 8.6 low.  HIV negative, 07/25/2019.  PT, PTT normal 07/20/2019.  I reviewed an admission note from 07/20/2019 at [facility] for left eye pain, decreased visual acuity left eye, left-sided facial numbness, status post tooth extraction  Today fingerstick hemoglobin 13.7.      Concerns:  Blood in stool-discussed possible causes.  He is on 60 mg prednisone daily for the past month ending in a couple days which may be contributing to blood in the stool or bleed.  Discussed other possible causes.  Given the tachycardia today and borderline orthostatics, we will have him come back tomorrow for repeat  hemoglobin orthostatics.  Discussed case with Dr. Redmond School supervising physician  I also do not see any prescription for taper down dose of prednisone.  Per records from [facility], the medications were supposed to carry him over until he sees specialist both neurology and ophthalmology soon.  Shakiness likely due to the elevated pulse rate which presumably is due to combination of anxiety and not feeling well given his ongoing tooth pain and issues with his eye.  Nevertheless additional labs today as below  Status post cochlear implant  Rash-I wonder if this is a drug reaction possibly to Bactrim.  The rash started within 36 hours of leaving the hospital on 07/30/2018.  The only medicines started that discharge was PPI, prednisone and Bactrim.  Follow-up pending labs  He has an appointment on Aug 30, 2019 with Dr. Evorn Gong, neurology in [facility], [phone]   Addendum: After reviewing his labs from yesterday showing significantly elevated liver test, I spoke to supervising physician Dr. Redmond School here who is his PCP.   We feel he needs urgent evaluation in the  emergency department given tachycardia, bright red blood in the stool, elevated liver test, new rash.  This could be a medication adverse reaction but could be something else as well.  He has been on 60 mg of prednisone daily for the last month ending tomorrow, Bactrim, and PPI.  The rash has been persistent since he left the hospital 07/26/2019 ([facility]), but he is also since established care with health department and started on medications for latent TB.  I suspect the medication reaction but could be other factors.    I called and spoke to his adult sister Tymier Lindholm as he was asleep.  They did not want to wake him up.  No one is on HIPAA.  I basically advised that he needs to call us back right away and that he will need to report to the emergency department for urgent evaluation.   His other sister Lelon Frohlich? Called back.  I gave the same info and she will take him to [facility] emergency dept now.  Zarius was seen today for blood in stools.  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

## 2019-08-18 ENCOUNTER — Emergency Department (HOSPITAL_COMMUNITY)
Admission: EM | Admit: 2019-08-18 | Discharge: 2019-08-18 | Disposition: A | Discharge status: Home / Self Care | Payer: Medicaid Other | Attending: Emergency Medicine | Admitting: Emergency Medicine

## 2019-08-18 ENCOUNTER — Other Ambulatory Visit: Payer: Medicaid Other

## 2019-08-18 ENCOUNTER — Ambulatory Visit: Payer: Medicaid Other | Admitting: Medical

## 2019-08-18 DIAGNOSIS — Z87891 Personal history of nicotine dependence: Secondary | ICD-10-CM | POA: Diagnosis not present

## 2019-08-18 DIAGNOSIS — K625 Hemorrhage of anus and rectum: Secondary | ICD-10-CM | POA: Diagnosis present

## 2019-08-18 DIAGNOSIS — R7401 Elevation of levels of liver transaminase levels: Secondary | ICD-10-CM | POA: Insufficient documentation

## 2019-08-18 LAB — COMPREHENSIVE METABOLIC PANEL
ALT: 401 IU/L — ABNORMAL HIGH (ref 0–44)
ALT: 432 U/L — ABNORMAL HIGH (ref 0–44)
AST: 166 IU/L — ABNORMAL HIGH (ref 0–40)
AST: 127 U/L — ABNORMAL HIGH (ref 15–41)
Albumin/Globulin Ratio: 1.5 (ref 1.2–2.2)
Albumin: 4.1 g/dL (ref 4.0–5.0)
Albumin: 3.7 g/dL (ref 3.5–5.0)
Alkaline Phosphatase: 90 IU/L (ref 39–117)
Alkaline Phosphatase: 73 U/L (ref 38–126)
Anion gap: 11 (ref 5–15)
BUN/Creatinine Ratio: 19 (ref 9–20)
BUN: 22 mg/dL — ABNORMAL HIGH (ref 6–20)
BUN: 24 mg/dL — ABNORMAL HIGH (ref 6–20)
Bilirubin Total: 0.9 mg/dL (ref 0.0–1.2)
CO2: 20 mmol/L (ref 20–29)
CO2: 21 mmol/L — ABNORMAL LOW (ref 22–32)
Calcium: 9.4 mg/dL (ref 8.7–10.2)
Calcium: 9 mg/dL (ref 8.9–10.3)
Chloride: 103 mmol/L (ref 96–106)
Chloride: 106 mmol/L (ref 98–111)
Creatinine, Ser: 1.18 mg/dL (ref 0.76–1.27)
Creatinine, Ser: 1.11 mg/dL (ref 0.61–1.24)
GFR calc Af Amer: 94 mL/min/{1.73_m2} (ref >59)
GFR calc Af Amer: >60 mL/min (ref >60)
GFR calc non Af Amer: 82 mL/min/{1.73_m2} (ref >59)
GFR calc non Af Amer: >60 mL/min (ref >60)
Globulin, Total: 2.8 g/dL (ref 1.5–4.5)
Glucose, Bld: 108 mg/dL — ABNORMAL HIGH (ref 70–99)
Glucose: 121 mg/dL — ABNORMAL HIGH (ref 65–99)
Potassium: 3.7 mmol/L (ref 3.5–5.2)
Potassium: 3.8 mmol/L (ref 3.5–5.1)
Sodium: 139 mmol/L (ref 134–144)
Sodium: 138 mmol/L (ref 135–145)
Total Bilirubin: 0.9 mg/dL (ref 0.3–1.2)
Total Protein: 6.9 g/dL (ref 6.0–8.5)
Total Protein: 7 g/dL (ref 6.5–8.1)

## 2019-08-18 LAB — HEPATITIS PANEL, ACUTE
HCV Ab: NONREACTIVE
Hep A IgM: NONREACTIVE
Hep B C IgM: NONREACTIVE
Hepatitis B Surface Ag: NONREACTIVE

## 2019-08-18 LAB — CBC
HCT: 48.9 % (ref 39.0–52.0)
Hemoglobin: 15.8 g/dL (ref 13.0–17.0)
MCH: 27.9 pg (ref 26.0–34.0)
MCHC: 32.3 g/dL (ref 30.0–36.0)
MCV: 86.2 fL (ref 80.0–100.0)
Platelets: 210 10*3/uL (ref 150–400)
RBC: 5.67 MIL/uL (ref 4.22–5.81)
RDW: 13.9 % (ref 11.5–15.5)
WBC: 10.8 10*3/uL — ABNORMAL HIGH (ref 4.0–10.5)
nRBC: 0 % (ref 0.0–0.2)

## 2019-08-18 LAB — CBC WITH DIFFERENTIAL/PLATELET
Basophils Absolute: 0.1 10*3/uL (ref 0.0–0.2)
Basos: 1 %
EOS (ABSOLUTE): 0.1 10*3/uL (ref 0.0–0.4)
Eos: 1 %
Hematocrit: 49.7 % (ref 37.5–51.0)
Hemoglobin: 16.3 g/dL (ref 13.0–17.7)
Immature Grans (Abs): 0.1 10*3/uL (ref 0.0–0.1)
Immature Granulocytes: 1 %
Lymphocytes Absolute: 2.8 10*3/uL (ref 0.7–3.1)
Lymphs: 27 %
MCH: 28 pg (ref 26.6–33.0)
MCHC: 32.8 g/dL (ref 31.5–35.7)
MCV: 85 fL (ref 79–97)
Monocytes Absolute: 0.8 10*3/uL (ref 0.1–0.9)
Monocytes: 8 %
Neutrophils Absolute: 6.4 10*3/uL (ref 1.4–7.0)
Neutrophils: 62 %
Platelets: 209 10*3/uL (ref 150–450)
RBC: 5.83 x10E6/uL — ABNORMAL HIGH (ref 4.14–5.80)
RDW: 14.1 % (ref 11.6–15.4)
WBC: 10.3 10*3/uL (ref 3.4–10.8)

## 2019-08-18 LAB — TYPE AND SCREEN
ABO/RH(D): B POS
Antibody Screen: NEGATIVE

## 2019-08-18 LAB — ETHANOL: Alcohol, Ethyl (B): <10 mg/dL (ref <10)

## 2019-08-18 LAB — ABO/RH: ABO/RH(D): B POS

## 2019-08-18 LAB — ACETAMINOPHEN LEVEL: Acetaminophen (Tylenol), Serum: <10 ug/mL — ABNORMAL LOW (ref 10–30)

## 2019-08-18 MED ORDER — SODIUM CHLORIDE 0.9 % IV BOLUS
1000.0000 mL | Freq: Once | INTRAVENOUS | Status: AC
  Administered 2019-08-18: 1000 mL via INTRAVENOUS

## 2019-08-18 NOTE — ED Triage Notes (Signed)
Pt here with rectal bleeding for several days. Pt went yesterday to PCP for evaluation and was called today for abnormal labs and told to follow up in the ED.

## 2019-08-18 NOTE — ED Notes (Signed)
Patient verbalizes understanding of discharge instructions . Opportunity for questions and answers were provided . Armband removed by staff ,Pt discharged from ED. W/C  offered at D/C  and Declined W/C at D/C and was escorted to lobby by RN.  

## 2019-08-18 NOTE — Discharge Instructions (Signed)
You were seen for your abnormal liver function.  It appears your liver function has been abnormal for approximately 1 year.  It is important to follow-up closely with gastroenterologist for further evaluation of your liver, as well as for rectal bleeding.  Medication to treat for tuberculosis may also affect your liver function therefore please discuss this closely with your provider who prescribed the medication.  Please avoid alcohol use, Tylenol use or anti-inflammatory medication at this time.  A hepatitis panel have been ordered and will likely result within the next 1 to 2 days.  Please check MyChart, link below, for result and discuss this with your primary care doctor.  Return if you have any concern.

## 2019-08-18 NOTE — Progress Notes (Addendum)
I am awaiting callback from patient.  There is no body else listed on HIPAA  FYI -   Of note, I called his home/numbers listed in the chart.  I spoke to his sister Nhung.  Another sister typically is the one that takes him to [facility] for care.  He primarily sees [facility] for his eye issues  There was no one else on HIPAA so I gave limited information.  Hallisey was apparently asleep.     I asked the sister to have him call us back ASAP to discuss his abnormal labs.  Also advised that he will need to go to the hospital either here in [facility], [facility] or [facility] if that is what he prefers.   I did not disclose the actual results but advised that given the discussion yesterday and labs today, he needs more urgent evaluation.  They did not want to wake him up just yet.  I advised to either bring him in when he wakes up to discuss in person given there is some language barrier and he has cochlear implants, or have him call back ASAP.

## 2019-08-18 NOTE — ED Provider Notes (Signed)
[facility] [facility] EMERGENCY DEPARTMENT Provider Note   CSN: [id number] Arrival date & time: 08/18/19  1028     History Chief Complaint  Patient presents with  . Abnormal Lab  . Rectal Bleeding    Antonio Li is a 29 y.o. male.  The history is provided by the patient, a relative and medical records. No language interpreter was used.  Abnormal Lab Rectal Bleeding    29 year old male with hx of TB, partial deafness, currently on TB regimen accompanied by sister to the ED for evaluation of abnormal labs.  History obtained through patient with the assistance of his sister who is at bedside.  Patient report he has had recurrent rectal bleeding.  Bleeding is painless, described as dark stools with trace of blood in that and sometimes less to drip into the toilet bowl.  He has had the symptoms for at least 2 years.  He decides to follow-up with his primary care doctor recently, had blood work and today was told to come to the ER because his labs were abnormal.  Furthermore, he admits to drinking alcohol but did not quantify the amount, states that he has been slowing down on his drinking for the past several months.  He also was recently evaluated at [facility] 2 months ago for facial numbness and vision loss on the left eye.  He is currently on prednisone.  He was discovered to have tuberculosis during his hospitalization without any complaints of cough or shortness of breath or fever.  He is currently on TB treatment for the past month.  He does take Tylenol on occasion approximately 1 to 2 pills a day every other day for aches and pains.  He was taken pain medication in the past but unsure if it was anti-inflammatory medication but states that he has not been taking any of that medication for at least a month.  He denies any fever chills nausea vomiting abdominal pain.  Last rectal bleeding was several days ago.  He is currently receiving TB treatment through the health center.   Past Medical  History:  Diagnosis Date  . Cochlear implant in place   . Deafness     Patient Active Problem List   Diagnosis Date Noted  . Cochlear implant in place 08/17/2019  . Shakiness 08/17/2019  . Blood in stool 08/17/2019  . Chronic dental pain 08/17/2019  . Rash 08/17/2019  . High risk medication use 08/17/2019  . Need for hepatitis B booster vaccination 08/17/2019  . 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 Language barrier 08/17/2019  . First degree heart block 11/23/2018  . Hyponatremia 09/06/2018  . Acute respiratory disease due to COVID-19 virus 09/06/2018  . Mild renal insufficiency 09/06/2018  . Allergic rhinitis due to pollen 06/12/2015    Past Surgical History:  Procedure Laterality Date  . COCHLEAR IMPLANT  2004  . COCHLEAR IMPLANT         No family history on file.  Social History   Tobacco Use  . Smoking status: Former Smoker    Quit date: 04/15/2009    Years since quitting: 10.3  . Smokeless tobacco: Never Used  Substance Use Topics  . Alcohol use: Yes    Alcohol/week: 0.0 standard drinks    Comment: socially 1-2 drinks once a month maybe (weekends)  . Drug use: No    Home Medications Prior to Admission medications   Medication Sig Start Date End Date Taking? Authorizing Provider  acetaminophen (  TYLENOL) 500 MG tablet Take 500 mg by mouth every 6 (six) hours as needed.    [provider]  albuterol (VENTOLIN HFA) 108 (90 Base) MCG/ACT inhaler Inhale 2 puffs into the lungs every 6 (six) hours as needed for wheezing or shortness of breath. Patient not taking: Reported on 11/23/2018 09/22/18   Denita Lung, MD  amoxicillin (AMOXIL) 875 MG tablet Take 1 tablet (875 mg total) by mouth 2 (two) times daily. Patient not taking: Reported on 08/17/2019 06/25/19   Denita Lung, MD  amoxicillin-clavulanate (AUGMENTIN) 875-125 MG tablet Take 1 tablet by mouth 2 (two) times daily. Patient not taking: Reported on 08/17/2019  07/20/19   Denita Lung, MD  benzonatate (TESSALON) 200 MG capsule Take 1 capsule (200 mg total) by mouth 3 (three) times daily. Patient not taking: Reported on 11/23/2018 09/12/18   Bonnielee Haff, MD  esomeprazole (NEXIUM) 20 MG capsule Take 20 mg by mouth daily at 12 noon.    [provider]  famotidine (PEPCID) 20 MG tablet Take 1 tablet (20 mg total) by mouth daily for 14 days. 09/12/18 09/26/18  Bonnielee Haff, MD  loratadine (CLARITIN) 10 MG tablet Take 10 mg by mouth daily.    [provider]  pantoprazole (PROTONIX) 40 MG tablet Take by mouth. 07/26/19 10/24/19  [provider]  polyethylene glycol (MIRALAX / GLYCOLAX) 17 g packet Take 17 g by mouth daily as needed for moderate constipation. Patient not taking: Reported on 11/23/2018 09/12/18   Bonnielee Haff, MD  predniSONE (DELTASONE) 20 MG tablet Take 1 tablets twice daily for 3 days, then take 1 tablet once daily for 3 days, then stop. 09/12/18   Bonnielee Haff, MD  sulfamethoxazole-trimethoprim (BACTRIM) 400-80 MG tablet Take 1 tablet by mouth daily. 08/02/19   [provider]    Allergies    Patient has no known allergies.  Review of Systems   Review of Systems  Gastrointestinal: Positive for hematochezia.  All other systems reviewed and are negative.   Physical Exam Updated Vital Signs BP 121/89 (BP Location: Right Arm)   Pulse 96   Temp 98 F (36.7 C) (Oral)   Resp 16   SpO2 97%   Physical Exam Vitals and nursing note reviewed.  Constitutional:      General: He is not in acute distress.    Appearance: He is well-developed.     Comments: Patient resting comfortably in bed in no acute discomfort.  Eye patch noted over left eye.  HENT:     Head: Normocephalic and atraumatic.  Eyes:     General: No scleral icterus.    Conjunctiva/sclera: Conjunctivae normal.  Cardiovascular:     Rate and Rhythm: Normal rate and regular rhythm.     Pulses: Normal pulses.     Heart sounds: Normal  heart sounds.  Pulmonary:     Effort: Pulmonary effort is normal.     Breath sounds: Normal breath sounds.  Abdominal:     Palpations: Abdomen is soft.     Tenderness: There is no abdominal tenderness.     Comments: No hepatomegaly.  Musculoskeletal:     Cervical back: Neck supple.  Skin:    Findings: No rash.  Neurological:     Mental Status: He is alert and oriented to person, place, and time.  Psychiatric:        Mood and Affect: Mood normal.     ED Results / Procedures / Treatments   Labs (all labs ordered are listed,  but only abnormal results are displayed) Labs Reviewed  COMPREHENSIVE METABOLIC PANEL - Abnormal; Notable for the following components:      Result Value   CO2 21 (*)    Glucose, Bld 108 (*)    BUN 24 (*)    AST 127 (*)    ALT 432 (*)    All other components within normal limits  CBC - Abnormal; Notable for the following components:   WBC 10.8 (*)    All other components within normal limits  ACETAMINOPHEN LEVEL - Abnormal; Notable for the following components:   Acetaminophen (Tylenol), Serum <10 (*)    All other components within normal limits  ETHANOL  HEPATITIS PANEL, ACUTE  POC OCCULT BLOOD, ED  TYPE AND SCREEN  ABO/RH    EKG None  Radiology No results found.  Procedures Procedures (including critical care time)  Medications Ordered in ED Medications  sodium chloride 0.9 % bolus 1,000 mL (1,000 mLs Intravenous New Bag/Given 08/18/19 1342)    ED Course  I have reviewed the triage vital signs and the nursing notes.  Pertinent labs & imaging results that were available during my care of the patient were reviewed by me and considered in my medical decision making (see chart for details).    MDM Rules/Calculators/A&P                      BP 121/89 (BP Location: Right Arm)   Pulse 96   Temp 98 F (36.7 C) (Oral)   Resp 16   SpO2 97%   Final Clinical Impression(s) / ED Diagnoses Final diagnoses:  Transaminitis    Rx / DC  Orders ED Discharge Orders    None     12:33 PM Patient sent here from PCP office due to abnormal labs, likely transaminitis.  Today AST 127, ALT 432, alk phos 33 and normal total bili of 0.9.  Remainder of labs are reassuring.  No evidence of anemia as hemoglobin is 15.8.  It appears patient has had evidence of transaminitis for approximately 1 year.  He does admits to alcohol use, occasional Tylenol use, unsure of hepatitis status.  He is currently on TB regimen for the past month which can progressively worsen his transaminitis but probably not likely to be the cause of his transaminitis.  He will need to follow-up closely with his doctor for change in his management.  He will also need to follow-up with GI specialist for transaminitis as well as rectal bleeding.  No active rectal bleeding at this time and he has been evaluated by PCP for this several days prior.  We will give referral to GI specialist, otherwise patient stable for discharge. Care discussed with Dr. Zenia Resides.   2:37 PM Normal Tylenol level, normal alcohol level.  Hepatitis panel is currently pending.  Will discharge patient with referral to GI specialist for outpatient evaluation.  Return precaution discussed.   Domenic Moras, PA-C 08/18/19 1443    Lacretia Leigh, MD 08/23/19 [phone]

## 2019-08-19 ENCOUNTER — Encounter: Payer: Self-pay | Admitting: Gastroenterology

## 2019-08-26 DIAGNOSIS — R7611 Nonspecific reaction to tuberculin skin test without active tuberculosis: Secondary | ICD-10-CM | POA: Diagnosis not present

## 2019-08-26 DIAGNOSIS — R7612 Nonspecific reaction to cell mediated immunity measurement of gamma interferon antigen response without active tuberculosis: Secondary | ICD-10-CM | POA: Diagnosis not present

## 2019-08-31 DIAGNOSIS — H47292 Other optic atrophy, left eye: Secondary | ICD-10-CM | POA: Diagnosis not present

## 2019-08-31 DIAGNOSIS — H0589 Other disorders of orbit: Secondary | ICD-10-CM | POA: Diagnosis not present

## 2019-08-31 DIAGNOSIS — H53412 Scotoma involving central area, left eye: Secondary | ICD-10-CM | POA: Diagnosis not present

## 2019-09-21 ENCOUNTER — Encounter: Payer: Self-pay | Admitting: Gastroenterology

## 2019-09-21 ENCOUNTER — Ambulatory Visit (INDEPENDENT_AMBULATORY_CARE_PROVIDER_SITE_OTHER): Payer: Medicaid Other | Admitting: Gastroenterology

## 2019-09-21 VITALS — BP 110/70 | HR 88 | Ht 63.0 in | Wt 185.0 lb

## 2019-09-21 DIAGNOSIS — R7401 Elevation of levels of liver transaminase levels: Secondary | ICD-10-CM | POA: Diagnosis not present

## 2019-09-21 DIAGNOSIS — K625 Hemorrhage of anus and rectum: Secondary | ICD-10-CM

## 2019-09-21 MED ORDER — NA SULFATE-K SULFATE-MG SULF 17.5-3.13-1.6 GM/177ML PO SOLN: 1.0000 | Freq: Once | ORAL | 0 refills | Status: AC

## 2019-09-21 NOTE — Progress Notes (Addendum)
[facility] Consult Note:  History: Antonio Li 09/21/2019  Referring provider: Denita Lung, MD  Reason for consult/chief complaint: Blood In Stools (last month for a week/ no blood or pain today )   Subjective  HPI:  This is a pleasant 29 year old man referred by primary care and emergency department after recent ED visit for rectal bleeding.  I find it difficult to get a clear history, at least in part because he has hearing impairment despite a cochlear implant.  It seems there has been some intermittent painless rectal bleeding for about a year.  It is difficult to characterize if he has diarrhea or constipation.  Sometimes he seems to have some crampy abdominal pain before BM, relieved afterwards.  His liver labs have been elevated for at least a year, but he does not seem to be aware of that.  He was drinking alcohol, perhaps heavily, until he stopped several months ago.  He is not aware of any personal or family history of liver disease. [name] is originally from [place].  He graduated from college several years ago, not currently working or in school.  See below, patient had some dental work, then had blurred vision and eye pain.  He was put on antibiotics for presumed sinusitis, then admitted at [facility] in early April for a retro-orbital mass that was felt to be probably inflammatory.  He was treated with prednisone, and also incidentally found to have a positive QuantiFERON gold test.  He was put on TB therapy, and it seems like he is probably about to finish it and does not know if he should continue it.  He is also uncertain who has been prescribing it and who to contact for that.  ROS:  Review of Systems He denies cough, night sweats, weight loss, chest pain or dyspnea. Remainder systems are negative is near can be determined.  Past Medical History: Past Medical History:  Diagnosis Date  . Cochlear implant in place   . Deafness    From [facility] admission  early April: " L orbital apex mass found on imaging, favored to be inflammatory, responded well to 5 days of IV steroids. Discharging on PO prednisone '60mg'$  daily until Ophthalmology follow up in 2 weeks - +QuantGold test; ID recommending treatment of latent TB, reported to [facility]. Faxed +quant gold, CT chest read so that Health Dept can initiate process of treatment - will need HBV vaccine as outpatient (noted to be non-immune here) - Cryoglobulins pending at time of discharge  Recommended Follow-up Studies:  BMP in 1 months with PCP CBC in 1 months with PCP  Follow-up/Care Transition Plan:  Future Appointments  Date Time Provider [place]  08/30/2019 8:30 AM Antonio Paris, MD [facility]    Brief History of Present Illness: Please see H&P for complete details.   In brief, two weeks ago the patient had a tooth extraction. Over the course of the last week he has had gradually increasing L eye pain and decreasing visual acuity. Reportedly developed associated left-sided facial numbness as well. ROS systems was limited by communication difficulties as above.   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 Ct scan was done with showed infiltrative vs. 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 CT head venogram limited by artifact from cochlear implant but no evidence of dural venous sinus thrombosis on wet read.  "  Past Surgical History: Past Surgical History:  Procedure Laterality Date  . COCHLEAR IMPLANT  2004  . COCHLEAR IMPLANT       Family History: Family History  Problem Relation Age of Onset  . Colon cancer Neg Hx   . Stomach  cancer Neg Hx   . Rectal cancer Neg Hx   . Pancreatic cancer Neg Hx     Social History: Social History   Socioeconomic History  . Marital status: Single    Spouse name: Not on file  . Number of children: Not on file  . Years of education: Not on file  . Highest education level: Not on file  Occupational History  . Not on file  Tobacco Use  . Smoking status: Former Smoker    Quit date: 04/15/2009    Years since quitting: 10.4  . Smokeless tobacco: Never Used  Substance and Sexual Activity  . Alcohol use: Yes    Alcohol/week: 0.0 standard drinks    Comment: socially 1-2 drinks once a month maybe (weekends)  . Drug use: No  . Sexual activity: Not on file  Other Topics Concern  . Not on file  Social History Narrative   Plays soccer   Social Determinants of Health   Financial Resource Strain:   . Difficulty of Paying Living Expenses:   Food Insecurity:   . Worried About Charity fundraiser in the Last Year:   . Arboriculturist in the Last Year:   Transportation Needs:   . Film/video editor (Medical):   Marland Kitchen Lack of Transportation (Non-Medical):   Physical Activity:   . Days of Exercise per Week:   . Minutes of Exercise per Session:   Stress:   . Feeling of Stress :   Social Connections:   . Frequency of Communication with Friends and Family:   . Frequency of Social Gatherings with Friends and Family:   . Attends Religious Services:   . Active Member of Clubs or Organizations:   . Attends Archivist Meetings:   Marland Kitchen Marital Status:     Allergies: No Known Allergies  Outpatient Meds: Current Outpatient Medications  Medication Sig Dispense Refill  . acetaminophen (TYLENOL) 500 MG tablet Take 500 mg by mouth every 6 (six) hours as needed for mild pain or headache.     . isoniazid (NYDRAZID) 300 MG tablet Take 900 mg by mouth once a week. Taken on Monday's    . pantoprazole (PROTONIX) 40 MG tablet Take 40 mg by mouth daily.     . predniSONE (DELTASONE) 20  MG tablet Take 1 tablets twice daily for 3 days, then take 1 tablet once daily for 3 days, then stop. (Patient taking differently: Take 60 mg by mouth in the morning. ) 9 tablet 0  . Pyridoxine HCl (VITAMIN B6 PO) Take 1 tablet by mouth once a week.    . Rifapentine (PRIFTIN) 150 MG TABS Take 6 tablets by mouth once a week.    . sulfamethoxazole-trimethoprim (BACTRIM) 400-80 MG tablet Take 3 tablets by mouth once a week. Taken on Monday's    . Na Sulfate-K Sulfate-Mg Sulf 17.5-3.13-1.6 GM/177ML SOLN Take 1 kit by mouth once for 1 dose. 354 mL 0   No current facility-administered medications for this visit.  ___________________________________________________________________ Objective   Exam:  BP 110/70   Pulse 88   Ht '5\' 3"'$  (1.6 m)   Wt 185 lb (83.9 kg)   BMI 32.77 kg/m    General: He is well-appearing, pleasant and conversational.  Eyes: sclera anicteric, no redness  ENT: oral mucosa moist without lesions, no cervical or supraclavicular lymphadenopathy  CV: RRR without murmur, S1/S2, no JVD, no peripheral edema  Resp: clear to auscultation bilaterally, normal RR and effort noted  GI: soft, no tenderness, with active bowel sounds. No guarding or palpable organomegaly noted.  Skin; warm and dry, no rash or jaundice noted  Neuro: awake, alert and oriented x 3. Normal gross motor function and fluent speech Rectal: Normal external, increased resting sphincter tone, scant light brown stool rectal vault.  No tenderness or palpable fissure.  Some posterior anal ridge like tissue?  Prior fissure  Labs:  CBC Latest Ref Rng & Units 08/18/2019 08/17/2019 08/17/2019  WBC 4.0 - 10.5 K/uL 10.8(H) 10.3 -  Hemoglobin 13.0 - 17.0 g/dL 15.8 16.3 13.7  Hematocrit 39.0 - 52.0 % 48.9 49.7 -  Platelets 150 - 400 K/uL 210 209 -   CMP Latest Ref Rng & Units 08/18/2019 08/17/2019 09/22/2018  Glucose 70 - 99 mg/dL 108(H) 121(H) 104(H)  BUN 6 - 20 mg/dL 24(H) 22(H) 20  Creatinine 0.61 - 1.24 mg/dL  1.11 1.18 1.13  Sodium 135 - 145 mmol/L 138 139 138  Potassium 3.5 - 5.1 mmol/L 3.8 3.7 4.1  Chloride 98 - 111 mmol/L 106 103 104  CO2 22 - 32 mmol/L 21(L) 20 19(L)  Calcium 8.9 - 10.3 mg/dL 9.0 9.4 9.1  Total Protein 6.5 - 8.1 g/dL 7.0 6.9 7.3  Total Bilirubin 0.3 - 1.2 mg/dL 0.9 0.9 0.4  Alkaline Phos 38 - 126 U/L 73 90 108  AST 15 - 41 U/L 127(H) 166(H) 57(H)  ALT 0 - 44 U/L 432(H) 401(H) 202(H)   Neg EtOh and tylenol levels  08/18/19  Negative acute hepatitis panel 08/18/19  [facility] records indicate neg Hep B Surface antibody  At [facility]: Lab Results  Component Value Date   IgG 1710 AST 34 07/20/2019  ALT 43 07/20/2019  ALKPHOS 79 07/20/2019  TBILI 0.5 07/20/2019  ALB 3.7 07/20/2019  TOTALPROTEIN 7.9 07/20/2019   Lab Results  Component Value Date  APTT 31.8 07/20/2019  INR 1.0 07/20/2019   Radiologic Studies:  No abdominal imaging on file here or as near as can be determined at [facility].  Assessment: Encounter Diagnoses  Name Primary?  . Rectal bleeding Yes  . Transaminitis    Patient was sent to [place] primarily for intermittent rectal bleeding that is difficult to characterize.  He was advised to have a colonoscopy and was agreeable after discussion of procedure and risks.  He will need his sister to bring him as a care partner that day, and he believes she will be able to do so.  Sign language interpreter will also be arranged that day.  Risks and benefits were discussed with patient and also showed to him in written form on the consent form.  The benefits and risks of the planned procedure were described in detail with the patient or (when appropriate) their health care proxy.  Risks were outlined as including, but not limited to, bleeding, infection, perforation, adverse medication reaction leading to cardiac or pulmonary decompensation, pancreatitis (if ERCP).  The limitation of incomplete mucosal visualization was also discussed.  No guarantees or warranties were  given.  Petra Kuba of  his elevated LFTs is also unclear.  They are significantly higher and an ALT predominant pattern that would not be typical for alcohol access.  In addition, he reports stopping alcohol months ago.  If that history is accurate, there would seem to be another explanation for these liver lab abnormalities.  He does not have chronic viral hepatitis, does not appear to have been worked up for metabolic or autoimmune liver disease. He does not definitely seem to have diarrhea, but must consider IBD such as ulcerative colitis with associated PSC.  However, [place] would typically cause elevated alkaline phosphatase and bilirubin.   Plan:  Colonoscopy. Right upper quadrant ultrasound Iron studies, alpha-1 antitrypsin, ceruloplasmin, autoimmune liver disease antibodies  I will forward this to his primary care provider so they can hopefully navigate his treatment of latent tuberculosis.  Thank you for the courtesy of this consult.  Please call me with any questions or concerns. (60 minutes total time, some communication challenges and extensive records to review.) Nelida Meuse III  CC: Referring provider noted above  Addendum 10/04/19 (day of colonoscopy)  Patient did not go for labs after 09/21/19 office visit. I checked with our staff and made sure lab orders had been placed, which they had. I also added a hepatic function panel today.  Cause of elevated LFTs unclear, since goes back over a year and INH only started during April hospitalization at [facility].  LFTs were normal during that stay (as noted above). LFTs significantly elevated on two recent checks since then, so possibility of the recent increase being at least partially from the [place] if he has been taking it.  Patient will be sent to lab on way out after procedure today, and all was explained to him with sister present and aid of sign interpreter. They will check his meds and, if still on INH, hold it until labs resulted.   (PCP visit note from today reviewed - Dr. Redmond School recommended patient contact [facility] re: Valentine treatment)  - H. Loletha Carrow, MD

## 2019-09-21 NOTE — Patient Instructions (Signed)
If you are age 31 or older, your body mass index should be between 23-30. Your Body mass index is 32.77 kg/m. If this is out of the aforementioned range listed, please consider follow up with your Primary Care Provider.  If you are age 53 or younger, your body mass index should be between 19-25. Your Body mass index is 32.77 kg/m. If this is out of the aformentioned range listed, please consider follow up with your Primary Care Provider.   You have been scheduled for a colonoscopy. Please follow written instructions given to you at your visit today.  Please pick up your prep supplies at the pharmacy within the next 1-3 days. If you use inhalers (even only as needed), please bring them with you on the day of your procedure. Your provider has requested that you go to the basement level for lab work before leaving today. Press "B" on the elevator. The lab is located at the first door on the left as you exit the elevator.  Due to recent changes in healthcare laws, you may see the results of your imaging and laboratory studies on MyChart before your provider has had a chance to review them.  We understand that in some cases there may be results that are confusing or concerning to you. Not all laboratory results come back in the same time frame and the provider may be waiting for multiple results in order to interpret others.  Please give [place] 48 hours in order for your provider to thoroughly review all the results before contacting the office for clarification of your results.   You have been scheduled for an abdominal ultrasound at [facility] (1st floor of hospital) on 09-27-2019 at 8am. Please arrive 15 minutes prior to your appointment for registration. Make certain not to have anything to eat or drink 6 hours prior to your appointment. Should you need to reschedule your appointment, please contact radiology at [phone]. This test typically takes about 30 minutes to perform.   It was a  pleasure to see you today!  Dr. Myrtie Neither

## 2019-09-22 ENCOUNTER — Telehealth: Payer: Self-pay

## 2019-09-22 NOTE — Telephone Encounter (Signed)
-----   Message from Ronnald Nian, MD sent at 09/22/2019  8:15 AM EDT ----- Hoyle Sauer will follow up on this. Sharlot Gowda ----- Message ----- From: Sherrilyn Rist, MD Sent: 09/21/2019   4:02 PM EDT To: Ronnald Nian, MD  Dr. Susann Givens,  This patient was seen after referral by ED for rectal bleeding and elevated LFTs.  I am not sure if you have seen his history over the last couple of months, but he is currently on treatment for probable latent TB prescribed by [facility] physicians during a brief admission there in early April. As near as I can tell from speaking with him today, it seems he is nearly out of that medicine and does not know whether he should continue it or who should prescribe it.  Hopefully you can help him navigate that.   Amada Jupiter, MD    [facility]

## 2019-09-22 NOTE — Telephone Encounter (Signed)
Pt was called to advise to call and schedule a follow up appt. From his refill to GI. KH

## 2019-09-23 ENCOUNTER — Telehealth: Payer: Self-pay

## 2019-09-23 NOTE — Telephone Encounter (Signed)
Called pt sister to advise of the need to follow up with pt per Dr.Lalonde. Follow up is for GI issues. KH

## 2019-09-27 ENCOUNTER — Ambulatory Visit (HOSPITAL_COMMUNITY)
Admission: RE | Admit: 2019-09-27 | Discharge: 2019-09-27 | Disposition: A | Discharge status: Home / Self Care | Payer: Medicaid Other | Source: Ambulatory Visit | Attending: Gastroenterology | Admitting: Gastroenterology

## 2019-09-27 ENCOUNTER — Other Ambulatory Visit: Payer: Self-pay

## 2019-09-27 DIAGNOSIS — R7612 Nonspecific reaction to cell mediated immunity measurement of gamma interferon antigen response without active tuberculosis: Secondary | ICD-10-CM | POA: Diagnosis not present

## 2019-09-27 DIAGNOSIS — K625 Hemorrhage of anus and rectum: Secondary | ICD-10-CM | POA: Diagnosis not present

## 2019-09-27 DIAGNOSIS — R7401 Elevation of levels of liver transaminase levels: Secondary | ICD-10-CM | POA: Insufficient documentation

## 2019-10-01 ENCOUNTER — Ambulatory Visit (INDEPENDENT_AMBULATORY_CARE_PROVIDER_SITE_OTHER): Payer: Medicaid Other

## 2019-10-01 ENCOUNTER — Other Ambulatory Visit: Payer: Self-pay | Admitting: Gastroenterology

## 2019-10-01 DIAGNOSIS — Z1159 Encounter for screening for other viral diseases: Secondary | ICD-10-CM

## 2019-10-04 ENCOUNTER — Other Ambulatory Visit: Payer: Self-pay

## 2019-10-04 ENCOUNTER — Telehealth: Payer: Self-pay

## 2019-10-04 ENCOUNTER — Encounter: Payer: Self-pay | Admitting: Family Medicine

## 2019-10-04 ENCOUNTER — Encounter: Payer: Self-pay | Admitting: Gastroenterology

## 2019-10-04 ENCOUNTER — Other Ambulatory Visit (INDEPENDENT_AMBULATORY_CARE_PROVIDER_SITE_OTHER): Payer: Medicaid Other

## 2019-10-04 ENCOUNTER — Ambulatory Visit (AMBULATORY_SURGERY_CENTER): Payer: Medicaid Other | Admitting: Gastroenterology

## 2019-10-04 ENCOUNTER — Ambulatory Visit: Payer: Medicaid Other | Admitting: Family Medicine

## 2019-10-04 VITALS — BP 118/70 | HR 91 | Temp 97.5°F | Wt 181.6 lb

## 2019-10-04 VITALS — BP 106/70 | HR 94 | Temp 96.9°F | Resp 20 | Ht 63.0 in | Wt 185.0 lb

## 2019-10-04 DIAGNOSIS — K625 Hemorrhage of anus and rectum: Secondary | ICD-10-CM

## 2019-10-04 DIAGNOSIS — K921 Melena: Secondary | ICD-10-CM | POA: Diagnosis not present

## 2019-10-04 DIAGNOSIS — I676 Nonpyogenic thrombosis of intracranial venous system: Secondary | ICD-10-CM

## 2019-10-04 DIAGNOSIS — R7612 Nonspecific reaction to cell mediated immunity measurement of gamma interferon antigen response without active tuberculosis: Secondary | ICD-10-CM | POA: Diagnosis not present

## 2019-10-04 DIAGNOSIS — H52512 Internal ophthalmoplegia (complete) (total), left eye: Secondary | ICD-10-CM | POA: Diagnosis not present

## 2019-10-04 DIAGNOSIS — Z9621 Cochlear implant status: Secondary | ICD-10-CM | POA: Diagnosis not present

## 2019-10-04 DIAGNOSIS — R7401 Elevation of levels of liver transaminase levels: Secondary | ICD-10-CM | POA: Diagnosis not present

## 2019-10-04 LAB — HEPATIC FUNCTION PANEL
ALT: 38 U/L (ref 0–53)
AST: 30 U/L (ref 0–37)
Albumin: 5 g/dL (ref 3.5–5.2)
Alkaline Phosphatase: 74 U/L (ref 39–117)
Bilirubin, Direct: 0.2 mg/dL (ref 0.0–0.3)
Total Bilirubin: 0.6 mg/dL (ref 0.2–1.2)
Total Protein: 8.3 g/dL (ref 6.0–8.3)

## 2019-10-04 LAB — SARS CORONAVIRUS 2 (TAT 6-24 HRS): SARS Coronavirus 2: NEGATIVE

## 2019-10-04 MED ORDER — SODIUM CHLORIDE 0.9 % IV SOLN: 500.0000 mL | INTRAVENOUS | Status: DC

## 2019-10-04 NOTE — Telephone Encounter (Signed)
lvm for sisiter to call back about pt eye doctor follow up in aug with [facility] and to follow up on TB in [place] [facility]. KH

## 2019-10-04 NOTE — Progress Notes (Signed)
To PACU, VSS. Report to [email or web address] 

## 2019-10-04 NOTE — Patient Instructions (Addendum)
Please read handouts provided. Continue present medications. Use Benefiber one tablespoon in glass of water daily.     YOU HAD AN ENDOSCOPIC PROCEDURE TODAY AT THE [facility]:   Refer to the procedure report that was given to you for any specific questions about what was found during the examination.  If the procedure report does not answer your questions, please call your gastroenterologist to clarify.  If you requested that your care partner not be given the details of your procedure findings, then the procedure report has been included in a sealed envelope for you to review at your convenience later.  YOU SHOULD EXPECT: Some feelings of bloating in the abdomen. Passage of more gas than usual.  Walking can help get rid of the air that was put into your GI tract during the procedure and reduce the bloating. If you had a lower endoscopy (such as a colonoscopy or flexible sigmoidoscopy) you may notice spotting of blood in your stool or on the toilet paper. If you underwent a bowel prep for your procedure, you may not have a normal bowel movement for a few days.  Please Note:  You might notice some irritation and congestion in your nose or some drainage.  This is from the oxygen used during your procedure.  There is no need for concern and it should clear up in a day or so.  SYMPTOMS TO REPORT IMMEDIATELY:   Following lower endoscopy (colonoscopy or flexible sigmoidoscopy):  Excessive amounts of blood in the stool  Significant tenderness or worsening of abdominal pains  Swelling of the abdomen that is new, acute  Fever of 100F or higher    For urgent or emergent issues, a gastroenterologist can be reached at any hour by calling [phone]. Do not use MyChart messaging for urgent concerns.    DIET:  We do recommend a small meal at first, but then you may proceed to your regular diet.  Drink plenty of fluids but you should avoid alcoholic beverages for 24  hours.  ACTIVITY:  You should plan to take it easy for the rest of today and you should NOT DRIVE or use heavy machinery until tomorrow (because of the sedation medicines used during the test).    FOLLOW UP: Our staff will call the number listed on your records 48-72 hours following your procedure to check on you and address any questions or concerns that you may have regarding the information given to you following your procedure. If we do not reach you, we will leave a message.  We will attempt to reach you two times.  During this call, we will ask if you have developed any symptoms of COVID 19. If you develop any symptoms (ie: fever, flu-like symptoms, shortness of breath, cough etc.) before then, please call [phone].  If you test positive for Covid 19 in the 2 weeks post procedure, please call and report this information to [place].    If any biopsies were taken you will be contacted by phone or by letter within the next 1-3 weeks.  Please call us at [phone] if you have not heard about the biopsies in 3 weeks.    SIGNATURES/CONFIDENTIALITY: You and/or your care partner have signed paperwork which will be entered into your electronic medical record.  These signatures attest to the fact that that the information above on your After Visit Summary has been reviewed and is understood.  Full responsibility of the confidentiality of this discharge information lies with you  and/or your care-partner.  _______________________________________________________  Per Dr. Loletha Carrow: Important - please go to the lab in the basement of this building on your way out to have lab work done. Also, please check you home medicines. If you still have isoniazid, do not take it until you have heard from [place] with the results of your liver blood tests and after you have contacted the physicians at [facility] who prescribed it (per Dr. Lanice Shirts advice when you saw him earlier today).

## 2019-10-04 NOTE — Progress Notes (Signed)
JB- Check-in  JB - VS  Pt has an interpreter for sign language, Kris Hartmann.  Pt hears some.  He has an cochlear implant in right ear. Maw

## 2019-10-04 NOTE — Op Note (Signed)
[facility] Patient Name: Antonio Li Procedure Date: 10/04/2019 1:59 PM MRN: [id number] Endoscopist: Sherilyn Cooter L. Myrtie Neither , MD Age: 29 Referring MD:  Date of Birth: 1990/12/14 Gender: Male Account #: [id number] Procedure:                Colonoscopy Indications:              Rectal bleeding (intermittent) Medicines:                Monitored Anesthesia Care Procedure:                Pre-Anesthesia Assessment:                           - Prior to the procedure, a History and Physical                            was performed, and patient medications and                            allergies were reviewed. The patient's tolerance of                            previous anesthesia was also reviewed. The risks                            and benefits of the procedure and the sedation                            options and risks were discussed with the patient.                            All questions were answered, and informed consent                            was obtained. Prior Anticoagulants: The patient has                            taken no previous anticoagulant or antiplatelet                            agents. ASA Grade Assessment: II - A patient with                            mild systemic disease. After reviewing the risks                            and benefits, the patient was deemed in                            satisfactory condition to undergo the procedure.                           After obtaining informed consent, the colonoscope  was passed under direct vision. Throughout the                            procedure, the patient's blood pressure, pulse, and                            oxygen saturations were monitored continuously. The                            Colonoscope was introduced through the anus and                            advanced to the the terminal ileum, with                            identification of the appendiceal orifice and  IC                            valve. The colonoscopy was performed without                            difficulty. The patient tolerated the procedure                            well. The quality of the bowel preparation was                            excellent. The terminal ileum, ileocecal valve,                            appendiceal orifice, and rectum were photographed.                            The bowel preparation used was SUPREP. Scope In: 2:09:25 PM Scope Out: 2:18:31 PM Scope Withdrawal Time: 0 hours 6 minutes 29 seconds  Total Procedure Duration: 0 hours 9 minutes 6 seconds  Findings:                 The perianal and digital rectal examinations were                            normal.                           The terminal ileum appeared normal.                           The entire examined colon appeared normal on direct                            and retroflexion views. Complications:            No immediate complications. Estimated Blood Loss:     Estimated blood loss: none. Impression:               - The examined portion of the ileum  was normal.                           - The entire examined colon is normal on direct and                            retroflexion views.                           - No specimens collected.                           Benign anal bleeding related to episodic                            constipation. Recommendation:           - Patient has a contact number available for                            emergencies. The signs and symptoms of potential                            delayed complications were discussed with the                            patient. Return to normal activities tomorrow.                            Written discharge instructions were provided to the                            patient.                           - Resume previous diet.                           - Continue present medications.                           - No  recommendation at this time regarding repeat                            colonoscopy due to young age.                           - Use Benefiber one tablespoon in glass of water                            daily. Justin Buechner L. Loletha Carrow, MD 10/04/2019 2:24:11 PM This report has been signed electronically.

## 2019-10-04 NOTE — Patient Instructions (Addendum)
You need to check with [facility] concerning continuing the isoniazid.. If there is any question call us back and we will get the health department involved Keep your appointment with Dr. Dione Booze  and also keep the appointment with the eye doctor at [facility] in August

## 2019-10-04 NOTE — Progress Notes (Signed)
   Subjective:    Patient ID: Antonio Li, male    DOB: 03-Jun-1990, 29 y.o.   MRN: [id number]  HPI He is here for a recheck.  He is with his interpreter, Kris Hartmann.  He has been seen at [facility] for evaluation of problems with his eyes.  He is also scheduled to be seen by Dr. Dione Booze today for follow-up visit and also an appointment in August at [facility].  As part of the work-up he did have a QuantiFERON test which was positive.  He was given INH but apparently was presently not taking it.  He is also scheduled to have colonoscopy later today.  Review of Systems     Objective:   Physical Exam Alert and in no distress.  The medical record through [facility] was evaluated.  He did have a positive QuantiFERON test done on April 7 and was given INH.  There is question about how long he is supposed to be on this medication.       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 Dione Booze today as well as have a colonoscopy today. I called the health department and talked to [facility] concerning the fact that he states that he is not taking the INH anymore.  She will follow-up with this and with his sister.  He was seen yesterday however left the note open as I did not hear from the health department yesterday but early this morning on 6/22

## 2019-10-05 LAB — IBC + FERRITIN
Ferritin: 183.3 ng/mL (ref 22.0–322.0)
Iron: 50 ug/dL (ref 42–165)
Saturation Ratios: 13.7 % — ABNORMAL LOW (ref 20.0–50.0)
Transferrin: 261 mg/dL (ref 212.0–360.0)

## 2019-10-06 ENCOUNTER — Telehealth: Payer: Self-pay

## 2019-10-06 NOTE — Telephone Encounter (Signed)
No answer on follow up call. 

## 2019-10-06 NOTE — Telephone Encounter (Signed)
Second follow up call attempt, no answer LM 

## 2019-10-07 LAB — MITOCHONDRIAL ANTIBODIES: Mitochondrial M2 Ab, IgG: <=20 U

## 2019-10-07 LAB — CERULOPLASMIN: Ceruloplasmin: 32 mg/dL (ref 18–36)

## 2019-10-07 LAB — ALPHA-1-ANTITRYPSIN: A-1 Antitrypsin, Ser: 137 mg/dL (ref 83–199)

## 2019-10-07 LAB — ANA: Anti Nuclear Antibody (ANA): NEGATIVE

## 2019-10-07 LAB — ANTI-SMOOTH MUSCLE ANTIBODY, IGG: Actin (Smooth Muscle) Antibody (IGG): <20 U (ref <20)

## 2019-10-08 ENCOUNTER — Encounter: Payer: Self-pay | Admitting: Family Medicine

## 2019-10-08 MED ORDER — VITAMIN B6 100 MG PO TABS
1.0000 | ORAL_TABLET | ORAL | 0 refills | Status: DC
Start: 2019-10-08 — End: 2020-06-22

## 2019-10-13 ENCOUNTER — Other Ambulatory Visit: Payer: Self-pay

## 2019-10-13 DIAGNOSIS — R7989 Other specified abnormal findings of blood chemistry: Secondary | ICD-10-CM

## 2019-10-20 DIAGNOSIS — R7612 Nonspecific reaction to cell mediated immunity measurement of gamma interferon antigen response without active tuberculosis: Secondary | ICD-10-CM | POA: Diagnosis not present

## 2019-10-29 ENCOUNTER — Other Ambulatory Visit (INDEPENDENT_AMBULATORY_CARE_PROVIDER_SITE_OTHER): Payer: Medicaid Other

## 2019-10-29 DIAGNOSIS — R7989 Other specified abnormal findings of blood chemistry: Secondary | ICD-10-CM

## 2019-10-29 LAB — HEPATIC FUNCTION PANEL
ALT: 48 U/L (ref 0–53)
AST: 37 U/L (ref 0–37)
Albumin: 4.5 g/dL (ref 3.5–5.2)
Alkaline Phosphatase: 67 U/L (ref 39–117)
Bilirubin, Direct: 0.1 mg/dL (ref 0.0–0.3)
Total Bilirubin: 0.4 mg/dL (ref 0.2–1.2)
Total Protein: 7.7 g/dL (ref 6.0–8.3)

## 2019-12-07 DIAGNOSIS — I676 Nonpyogenic thrombosis of intracranial venous system: Secondary | ICD-10-CM | POA: Diagnosis not present

## 2020-06-22 ENCOUNTER — Encounter: Payer: Self-pay | Admitting: Family Medicine

## 2020-06-22 ENCOUNTER — Ambulatory Visit: Payer: Medicaid Other | Admitting: Family Medicine

## 2020-06-22 ENCOUNTER — Other Ambulatory Visit: Payer: Self-pay

## 2020-06-22 ENCOUNTER — Ambulatory Visit
Admission: RE | Admit: 2020-06-22 | Discharge: 2020-06-22 | Disposition: A | Discharge status: Home / Self Care | Payer: Medicaid Other | Source: Ambulatory Visit | Attending: Family Medicine | Admitting: Family Medicine

## 2020-06-22 VITALS — BP 100/70 | HR 60 | Ht 63.0 in | Wt 191.0 lb

## 2020-06-22 DIAGNOSIS — J302 Other seasonal allergic rhinitis: Secondary | ICD-10-CM | POA: Diagnosis not present

## 2020-06-22 DIAGNOSIS — R319 Hematuria, unspecified: Secondary | ICD-10-CM

## 2020-06-22 DIAGNOSIS — R0781 Pleurodynia: Secondary | ICD-10-CM

## 2020-06-22 DIAGNOSIS — S299XXA Unspecified injury of thorax, initial encounter: Secondary | ICD-10-CM | POA: Diagnosis not present

## 2020-06-22 LAB — POCT URINALYSIS DIP (PROADVANTAGE DEVICE)
Bilirubin, UA: NEGATIVE
Blood, UA: NEGATIVE
Glucose, UA: NEGATIVE mg/dL
Ketones, POC UA: NEGATIVE mg/dL
Leukocytes, UA: NEGATIVE
Nitrite, UA: NEGATIVE
Specific Gravity, Urine: 1.025
Urobilinogen, Ur: NEGATIVE
pH, UA: 6 (ref 5.0–8.0)

## 2020-06-22 NOTE — Patient Instructions (Addendum)
Stay well hydrated--drink plenty of water. You may use ice or heat to the ribs (or alternate--do whatever feels best). Use tylenol for pain if needed (don't use ibuprofen/advil/aleve)  Go to [facility] ([address]) to get x-rays of your ribs.    Please let us know if you have ongoing issues with blood in the urine. The urine today was clear (no blood noted).   You may continue to use Claritin every day, if needed for allergies.  You should only take it once daily (per instructions on the box). If it doesn't work well enough, you can either try switching to a different antihistamine pill (such as allegra or zyrtec, both also used once daily), or you can ADD taking a nasal steroid spray such as Flonase.  These nose sprays are used once daily (2 sprays into each nostril), and should be used EVERY DAY for the season of your allergies.  You may be able to use the claritin (antihistamine) less often if allergies are controlled by the Flonase, but it is safe to use both medications (pill and spray) daily for the whole spring, if needed.   Allergic Rhinitis, Adult Allergic rhinitis is a reaction to allergens. Allergens are things that can cause an allergic reaction. This condition affects the lining inside the nose (mucous membrane). There are two types of allergic rhinitis:  Seasonal. This type is also called hay fever. It happens only during some times of the year.  Perennial. This type can happen at any time of the year. This condition cannot be spread from person to person (is not contagious). It can be mild, worse, or very bad. It can develop at any age and may be outgrown. What are the causes? This condition may be caused by:  Pollen from grasses, trees, and weeds.  Dust mites.  Smoke.  Mold.  Car fumes.  The pee (urine), spit, or dander of pets. Dander is dead skin cells from a pet.   What increases the risk? You are more likely to develop this condition  if:  You have allergies in your family.  You have problems like allergies in your family. You may have: ? Swelling of parts of your eyes and eyelids. ? Asthma. This affects how you breathe. ? Long-term redness and swelling on your skin. ? Food allergies. What are the signs or symptoms? The main symptom of this condition is a runny or stuffy nose (nasal congestion). Other symptoms may include:  Sneezing or coughing.  Itching and tearing of your eyes.  Mucus that drips down the back of your throat (postnasal drip).  Trouble sleeping.  Feeling tired.  Headache.  Sore throat. How is this treated? There is no cure for this condition. You should avoid things that you are allergic to. Treatment can help to relieve symptoms. This may include:  Medicines that block allergy symptoms, such as corticosteroids or antihistamines. These may be given as a shot, nasal spray, or pill.  Avoiding things you are allergic to.  Medicines that give you bits of what you are allergic to over time. This is called immunotherapy. It is done if other treatments do not help. You may get: ? Shots. ? Medicine under your tongue.  Stronger medicines, if other treatments do not help. Follow these instructions at home: Avoiding allergens Find out what things you are allergic to and avoid them. To do this, try these things:  If you get allergies any time of year: ? Replace carpet with wood,  tile, or vinyl flooring. Carpet can trap pet dander and dust. ? Do not smoke. Do not allow smoking in your home. ? Change your heating and air conditioning filters at least once a month.  If you get allergies only some times of the year: ? Keep windows closed when you can. ? Plan things to do outside when pollen counts are lowest. Check pollen counts before you plan things to do outside. ? When you come indoors, change your clothes and shower before you sit on furniture or bedding.   If you are allergic to a  pet: ? Keep the pet out of your bedroom. ? Vacuum, sweep, and dust often.   General instructions  Take over-the-counter and prescription medicines only as told by your doctor.  Drink enough fluid to keep your pee (urine) pale yellow.  Keep all follow-up visits as told by your doctor. This is important. Where to find more information  American Academy of Allergy, Asthma & Immunology: www.aaaai.org Contact a doctor if:  You have a fever.  You get a cough that does not go away.  You make whistling sounds when you breathe (wheeze).  Your symptoms slow you down.  Your symptoms stop you from doing your normal things each day. Get help right away if:  You are short of breath. This symptom may be an emergency. Do not wait to see if the symptom will go away. Get medical help right away. Call your local emergency services (911 in the [place] Do not drive yourself to the hospital. Summary  Allergic rhinitis may be treated by taking medicines and avoiding things you are allergic to.  If you have allergies only some of the year, keep windows closed when you can at those times.  Contact your doctor if you get a fever or a cough that does not go away. This information is not intended to replace advice given to you by your health care provider. Make sure you discuss any questions you have with your health care provider. Document Revised: 05/24/2019 Document Reviewed: 03/30/2019 Elsevier Patient Education  2021 [employer].

## 2020-06-22 NOTE — Progress Notes (Signed)
Chief Complaint  Patient presents with   Rib Injury    Was playing soccer last night and got hit on right side in his rib area but the other players butt. He was running. This morning first urine had a lot of blood in it, no pain. Second urine no blood. Hurts a little when he takes a deep breath.    Last night while playing soccer, he sustained an injury to his R lower ribs. He noticed blood in his urine on first void this morning--light red in toilet. No clots. There was no pain with urination. Didn't see any blood with 2nd void today.  No blood staining in underwear.  He currently has some pain at R lower ribs Had some upper abdominal pain (centrally) this morning, resolved after voiding. No further abdominal pain No back pain  He is also reporting concerns with allergies.  He states that 2 weeks ago he had itchy throat and eyes.  He took Claritin, which helped.  Symptoms recurred after stopping the medication. He gets similar symptoms every Spring.   PMH, PSH, SH reviewed  Medications--none currently Reports he finished meds for +quantiferon  Outpatient Encounter Medications as of 06/22/2020  Medication Sig   acetaminophen (TYLENOL) 500 MG tablet Take 500 mg by mouth every 6 (six) hours as needed for mild pain or headache.  (Patient not taking: No sig reported)   isoniazid (NYDRAZID) 300 MG tablet Take 900 mg by mouth once a week. Taken on Monday's (Patient not taking: No sig reported)   [DISCONTINUED] albuterol (VENTOLIN HFA) 108 (90 Base) MCG/ACT inhaler Inhale 2 puffs into the lungs every 6 (six) hours as needed for wheezing or shortness of breath. (Patient not taking: Reported on 11/23/2018)   [DISCONTINUED] famotidine (PEPCID) 20 MG tablet Take 1 tablet (20 mg total) by mouth daily for 14 days. (Patient not taking: Reported on 08/18/2019)   [DISCONTINUED] predniSONE (DELTASONE) 20 MG tablet Take 1 tablets twice daily for 3 days, then take 1 tablet once daily for 3 days, then  stop. (Patient not taking: No sig reported)   [DISCONTINUED] Pyridoxine HCl (VITAMIN B6) 100 MG TABS Take 1 tablet by mouth once a week. (Patient not taking: Reported on 06/22/2020)   [DISCONTINUED] Rifapentine (PRIFTIN) 150 MG TABS Take 6 tablets by mouth once a week. (Patient not taking: No sig reported)   No facility-administered encounter medications on file as of 06/22/2020.    No Known Allergies  ROS: No fever, chills, headache, chest pain. No n/v/d. Hematuria per HPI. No dysuria, abdominal or flank pain.  No other bleeding, no bruising. See HPI   PHYSICAL EXAM:  BP 100/70    Pulse 60    Ht 5\' 3"  (1.6 m)    Wt 191 lb (86.6 kg)    BMI 33.83 kg/m   Well-appearing, pleasant male, in mild discomfort at R lower chest with palpations and some movements. Tender at inferomedial ribs on R (at costochondral junction/medial aspect of floating ribs) Also has an area of tenderness more central in the lower 2 ribs.   No bony stepoffs palpable. Heart: regular rate and rhythm Lungs: clear bilaterally, normal air movement Abdomen: soft, nontender, no mass Back: no CVA tenderness  Urine: SG 1.025, trace protein. No blood, leuks, nitrite.   ASSESSMENT/PLAN:  Hematuria, unspecified type - one episode, nl u/a here.  Trauma yesterday--anterior ribs. Check for rib fx, consider further eval of kidneys if fx or if recurrent hematuria - Plan: POCT Urinalysis DIP (Proadvantage Device), DG  Ribs Unilateral Right  Rib pain on right side - x-ray to evaluate for fracture - Plan: DG Ribs Unilateral Right  Seasonal allergies - reviewed treatment with antihistamines, inhaled steroid if insufficient response, proper use reviewed   Stay well hydrated--drink plenty of water. You may use ice or heat to the ribs (or alternate--do whatever feels best). Use tylenol for pain if needed (don't use ibuprofen/advil/aleve)  Go to [facility] ([address]) to get x-rays of your ribs.    Please let  us know if you have ongoing issues with blood in the urine. The urine today was clear (no blood noted).  You may continue to use Claritin every day, if needed for allergies.  You should only take it once daily (per instructions on the box). If it doesn't work well enough, you can either try switching to a different antihistamine pill (such as allegra or zyrtec, both also used once daily), or you can ADD taking a nasal steroid spray such as Flonase.  These nose sprays are used once daily (2 sprays into each nostril), and should be used EVERY DAY for the season of your allergies.  You may be able to use the claritin (antihistamine) less often if allergies are controlled by the Flonase, but it is safe to use both medications (pill and spray) daily for the whole spring, if needed.   I spent 33  minutes dedicated to the care of this patient, including pre-visit review of records, face to face time, post-visit ordering of testing and documentation.

## 2021-02-12 DIAGNOSIS — H903 Sensorineural hearing loss, bilateral: Secondary | ICD-10-CM | POA: Diagnosis not present

## 2021-02-12 DIAGNOSIS — Z45321 Encounter for adjustment and management of cochlear device: Secondary | ICD-10-CM | POA: Diagnosis not present

## 2021-05-17 DIAGNOSIS — H903 Sensorineural hearing loss, bilateral: Secondary | ICD-10-CM | POA: Diagnosis not present

## 2021-05-30 ENCOUNTER — Other Ambulatory Visit: Payer: Self-pay

## 2021-05-30 ENCOUNTER — Other Ambulatory Visit (INDEPENDENT_AMBULATORY_CARE_PROVIDER_SITE_OTHER): Payer: Medicaid Other

## 2021-05-30 DIAGNOSIS — Z23 Encounter for immunization: Secondary | ICD-10-CM | POA: Diagnosis not present

## 2021-06-19 ENCOUNTER — Other Ambulatory Visit (INDEPENDENT_AMBULATORY_CARE_PROVIDER_SITE_OTHER): Payer: Medicaid Other

## 2021-06-19 DIAGNOSIS — Z23 Encounter for immunization: Secondary | ICD-10-CM

## 2021-06-26 DIAGNOSIS — Z461 Encounter for fitting and adjustment of hearing aid: Secondary | ICD-10-CM | POA: Diagnosis not present

## 2021-06-26 DIAGNOSIS — Z9621 Cochlear implant status: Secondary | ICD-10-CM | POA: Diagnosis not present

## 2021-06-26 DIAGNOSIS — H903 Sensorineural hearing loss, bilateral: Secondary | ICD-10-CM | POA: Diagnosis not present

## 2021-07-10 ENCOUNTER — Other Ambulatory Visit: Payer: Self-pay

## 2021-07-10 ENCOUNTER — Encounter: Payer: Self-pay | Admitting: Family Medicine

## 2021-07-10 ENCOUNTER — Ambulatory Visit (INDEPENDENT_AMBULATORY_CARE_PROVIDER_SITE_OTHER): Payer: Medicaid Other | Admitting: Family Medicine

## 2021-07-10 VITALS — BP 104/66 | HR 77 | Temp 98.4°F | Wt 187.4 lb

## 2021-07-10 DIAGNOSIS — J301 Allergic rhinitis due to pollen: Secondary | ICD-10-CM | POA: Diagnosis not present

## 2021-07-10 DIAGNOSIS — Z8616 Personal history of COVID-19: Secondary | ICD-10-CM | POA: Diagnosis not present

## 2021-07-10 DIAGNOSIS — Z9621 Cochlear implant status: Secondary | ICD-10-CM | POA: Diagnosis not present

## 2021-07-10 DIAGNOSIS — Z23 Encounter for immunization: Secondary | ICD-10-CM | POA: Diagnosis not present

## 2021-07-10 DIAGNOSIS — R7612 Nonspecific reaction to cell mediated immunity measurement of gamma interferon antigen response without active tuberculosis: Secondary | ICD-10-CM

## 2021-07-10 DIAGNOSIS — I676 Nonpyogenic thrombosis of intracranial venous system: Secondary | ICD-10-CM

## 2021-07-10 DIAGNOSIS — Z13220 Encounter for screening for lipoid disorders: Secondary | ICD-10-CM | POA: Diagnosis not present

## 2021-07-10 NOTE — Progress Notes (Signed)
? ?  Subjective:  ? ? Patient ID: Antonio Li, male    DOB: [birth year], 31 y.o.   MRN: [id number] ? ?HPI ?He is here for evaluation.  He is with his interpreter, Anabel Bene.  He states that he has a new cochlear implant which apparently is the latest variation.  He does have underlying allergies and is on Zyrtec.  Does have a previous history of COVID.  He continues to have difficulty with visual changes especially on the left.  Apparently he is disabled because of the hearing and therefore not working at the present time.  He states that he potentially would like to work.  He does not smoke or drink.  He is not sexually active.  He was treated last year for positive QuantiFERON him presently is not on any medication.  Has no other concerns or complaints. ? ? ?Review of Systems ? ?   ?Objective:  ? Physical Exam ?Alert and in no distress. Tympanic membranes and canals are normal. Pharyngeal area is normal. Neck is supple without adenopathy or thyromegaly. Cardiac exam shows a regular sinus rhythm without murmurs or gallops. Lungs are clear to auscultation. ? ? ? ? ?   ?Assessment & Plan:  ?Need for COVID-19 vaccine - Plan: Research officer, trade union ? ?Seasonal allergic rhinitis due to pollen - Plan: CBC with Differential/Platelet, Comprehensive metabolic panel ? ?Cochlear implant in place ? 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 ?Orbital apex-sphenoid syndrome - Plan: CBC with Differential/Platelet, Comprehensive metabolic panel ? ?Screening for lipid disorders - Plan: Lipid panel ? ?History of COVID-19 ?He is interested in seeing a dentist and I explained that he would need to check around to see who is excepting Medicaid.  Otherwise I will do routine blood screening and encouraged him to stay as physically active as possible.  His diction is much better than in the past. ? ?

## 2021-07-11 ENCOUNTER — Encounter: Payer: Self-pay | Admitting: Family Medicine

## 2021-07-11 LAB — COMPREHENSIVE METABOLIC PANEL
ALT: 21 IU/L (ref 0–44)
AST: 22 IU/L (ref 0–40)
Albumin/Globulin Ratio: 1.4 (ref 1.2–2.2)
Albumin: 4.4 g/dL (ref 4.0–5.0)
Alkaline Phosphatase: 99 IU/L (ref 44–121)
BUN/Creatinine Ratio: 17 (ref 9–20)
BUN: 20 mg/dL (ref 6–20)
Bilirubin Total: 0.2 mg/dL (ref 0.0–1.2)
CO2: 23 mmol/L (ref 20–29)
Calcium: 9.3 mg/dL (ref 8.7–10.2)
Chloride: 105 mmol/L (ref 96–106)
Creatinine, Ser: 1.18 mg/dL (ref 0.76–1.27)
Globulin, Total: 3.2 g/dL (ref 1.5–4.5)
Glucose: 89 mg/dL (ref 70–99)
Potassium: 4.4 mmol/L (ref 3.5–5.2)
Sodium: 141 mmol/L (ref 134–144)
Total Protein: 7.6 g/dL (ref 6.0–8.5)
eGFR: 84 mL/min/{1.73_m2} (ref >59)

## 2021-07-11 LAB — CBC WITH DIFFERENTIAL/PLATELET
Basophils Absolute: 0.1 10*3/uL (ref 0.0–0.2)
Basos: 1 %
EOS (ABSOLUTE): 0.2 10*3/uL (ref 0.0–0.4)
Eos: 2 %
Hematocrit: 46.3 % (ref 37.5–51.0)
Hemoglobin: 15.3 g/dL (ref 13.0–17.7)
Immature Grans (Abs): 0 10*3/uL (ref 0.0–0.1)
Immature Granulocytes: 0 %
Lymphocytes Absolute: 2 10*3/uL (ref 0.7–3.1)
Lymphs: 25 %
MCH: 28.2 pg (ref 26.6–33.0)
MCHC: 33 g/dL (ref 31.5–35.7)
MCV: 85 fL (ref 79–97)
Monocytes Absolute: 0.6 10*3/uL (ref 0.1–0.9)
Monocytes: 7 %
Neutrophils Absolute: 5.1 10*3/uL (ref 1.4–7.0)
Neutrophils: 65 %
Platelets: 301 10*3/uL (ref 150–450)
RBC: 5.43 x10E6/uL (ref 4.14–5.80)
RDW: 12.5 % (ref 11.6–15.4)
WBC: 8 10*3/uL (ref 3.4–10.8)

## 2021-07-11 LAB — LIPID PANEL
Chol/HDL Ratio: 2.9 ratio (ref 0.0–5.0)
Cholesterol, Total: 155 mg/dL (ref 100–199)
HDL: 54 mg/dL (ref >39)
LDL Chol Calc (NIH): 60 mg/dL (ref 0–99)
Triglycerides: 263 mg/dL — ABNORMAL HIGH (ref 0–149)
VLDL Cholesterol Cal: 41 mg/dL — ABNORMAL HIGH (ref 5–40)

## 2021-07-18 ENCOUNTER — Ambulatory Visit: Payer: Medicaid Other

## 2021-08-02 IMAGING — CR DG RIBS 2V*R*
2 series · 2 of 2 positions shown · non-contrast
Comparison: None.

CLINICAL DATA: The patient suffered a right chest wall injury
playing soccer last night. Pain. Initial encounter.

EXAM:
RIGHT RIBS - 2 VIEW

[w ribs ap upper right]
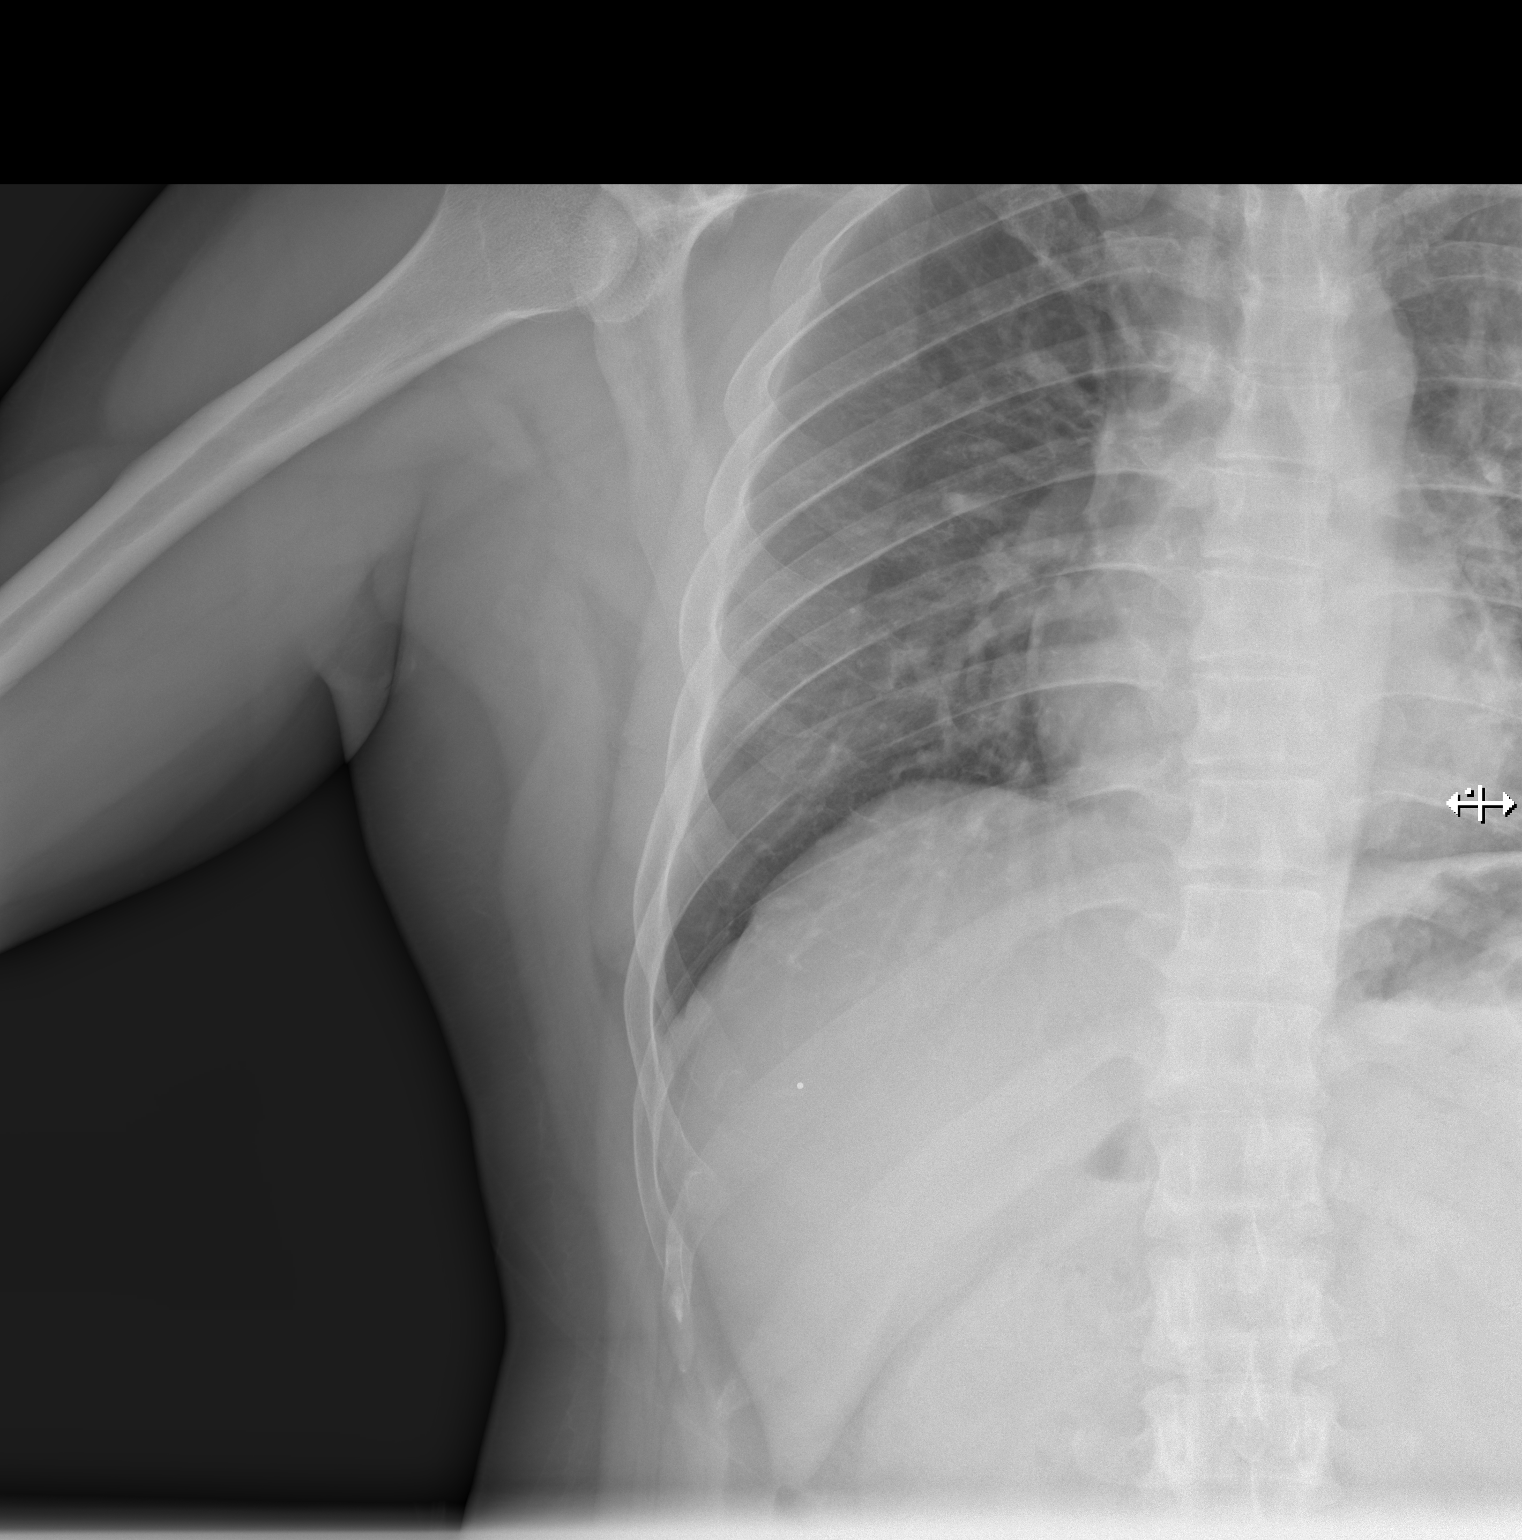

[w ribs ap lower right]
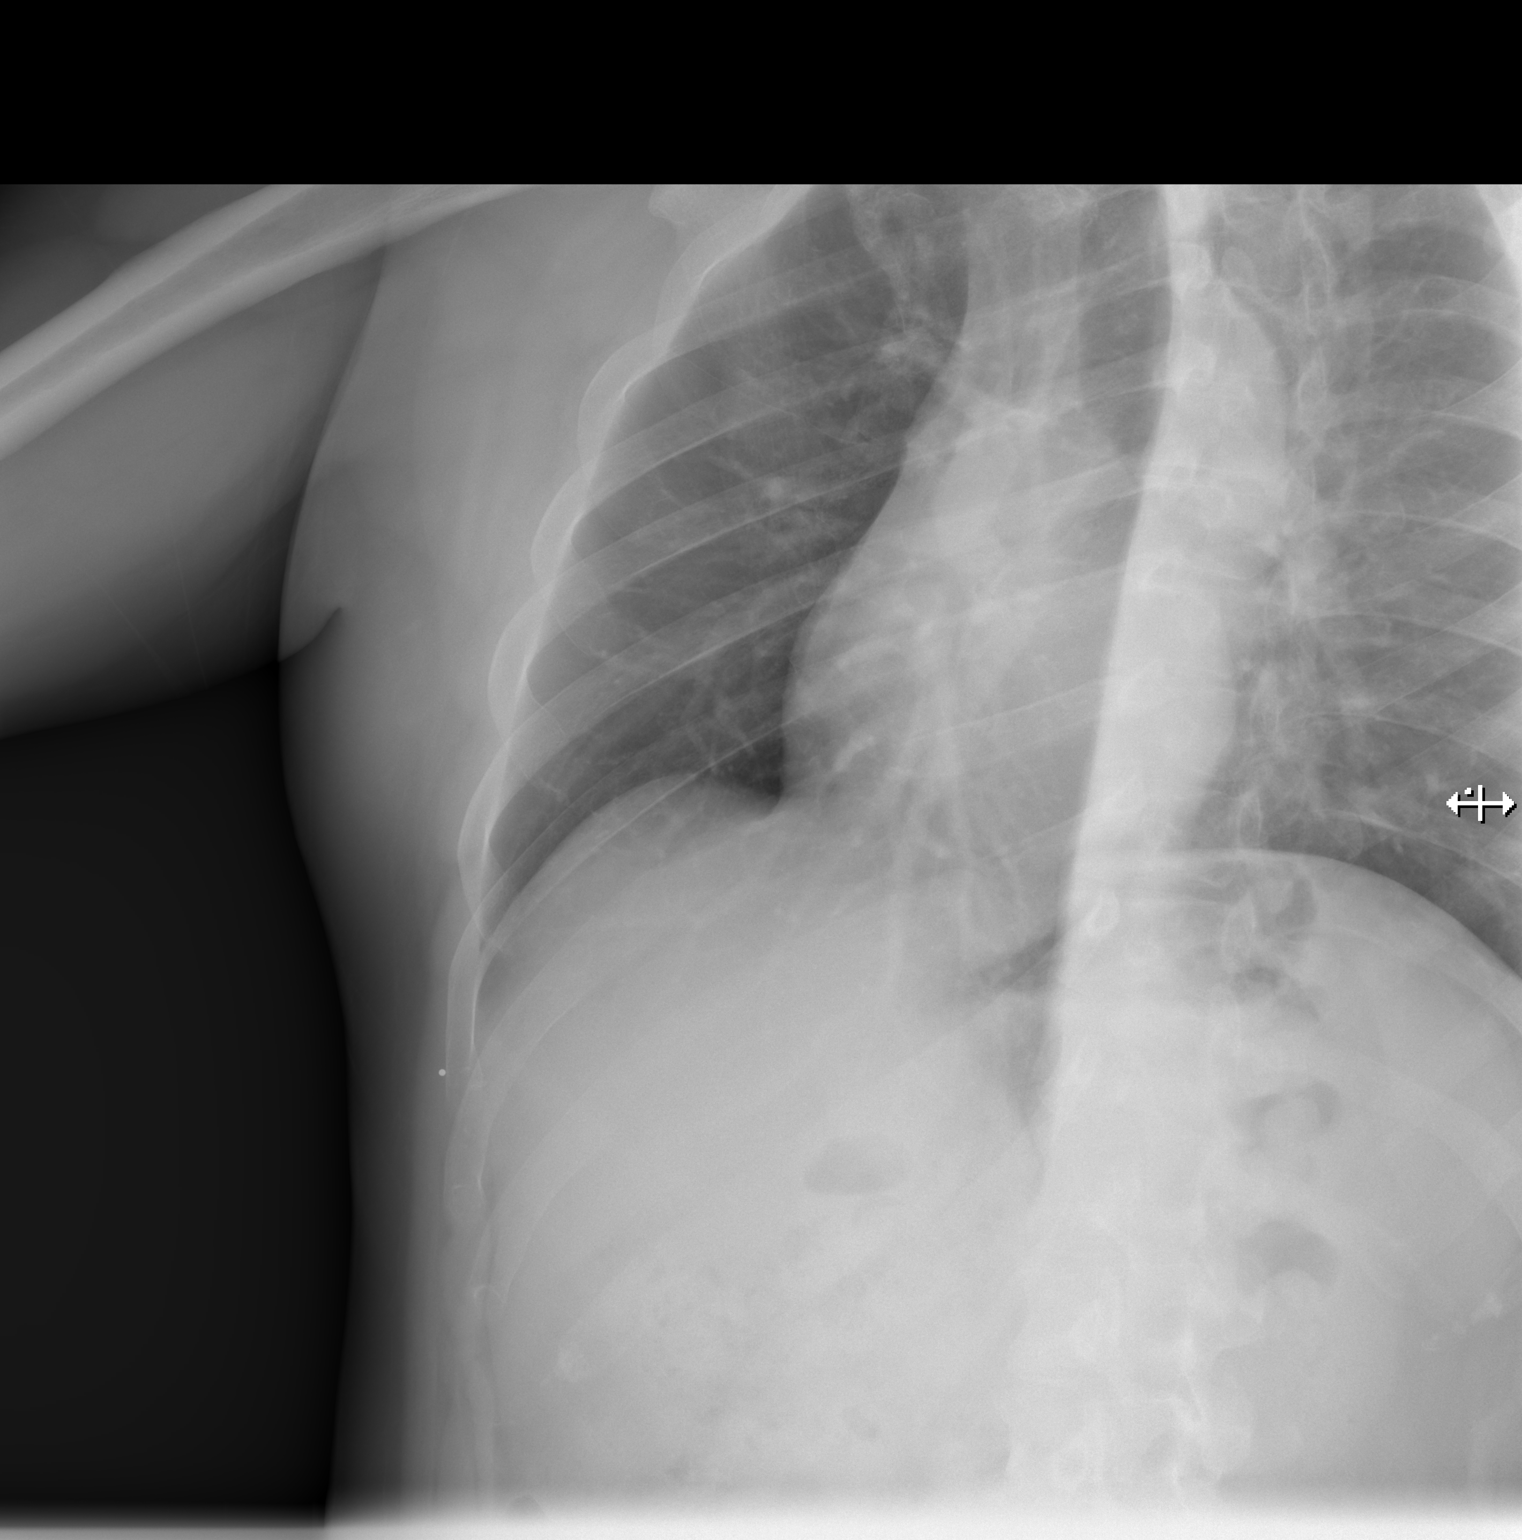

[2 of 2 positions shown; findings below may reference images not displayed]

FINDINGS: No fracture or other bone lesions are seen involving the ribs.
IMPRESSION: Negative exam.

## 2021-08-23 ENCOUNTER — Encounter: Payer: Self-pay | Admitting: Family Medicine

## 2021-08-23 ENCOUNTER — Ambulatory Visit: Payer: Medicaid Other | Admitting: Family Medicine

## 2021-08-23 VITALS — BP 100/62 | HR 81 | Temp 97.3°F | Wt 184.6 lb

## 2021-08-23 DIAGNOSIS — H9191 Unspecified hearing loss, right ear: Secondary | ICD-10-CM

## 2021-08-23 DIAGNOSIS — Z9621 Cochlear implant status: Secondary | ICD-10-CM

## 2021-08-23 NOTE — Progress Notes (Signed)
[name] ? ?Subjective:  ? ? Patient ID: Antonio Li, male    DOB: [birth year], 31 y.o.   MRN: [id number] ? ?HPI ?He is here to have paperwork filled out concerning his cochlear implant.  This mainly revolves around potentially being stopped by police or early in airports etc. to help him navigate the system. ? ? ?Review of Systems ? ?   ?Objective:  ? Physical Exam ? ?Alert and in no distress.  Cochlear implant is in place on the right. ? ? ?   ?Assessment & Plan:  ?Cochlear implant in place ? ?Hearing loss of right ear, unspecified hearing loss type ?Paperwork was filled out for him. ? ?

## 2021-12-19 ENCOUNTER — Encounter: Payer: Self-pay | Admitting: Internal Medicine

## 2022-01-01 ENCOUNTER — Ambulatory Visit (INDEPENDENT_AMBULATORY_CARE_PROVIDER_SITE_OTHER): Payer: Medicaid Other | Admitting: Family Medicine

## 2022-01-01 VITALS — BP 130/70 | HR 47 | Temp 97.7°F | Ht 63.0 in | Wt 182.0 lb

## 2022-01-01 DIAGNOSIS — Z9621 Cochlear implant status: Secondary | ICD-10-CM | POA: Diagnosis not present

## 2022-01-01 DIAGNOSIS — Z8616 Personal history of COVID-19: Secondary | ICD-10-CM

## 2022-01-01 DIAGNOSIS — J301 Allergic rhinitis due to pollen: Secondary | ICD-10-CM

## 2022-01-01 DIAGNOSIS — Z23 Encounter for immunization: Secondary | ICD-10-CM

## 2022-01-01 DIAGNOSIS — Z0000 Encounter for general adult medical examination without abnormal findings: Secondary | ICD-10-CM | POA: Diagnosis not present

## 2022-01-01 NOTE — Progress Notes (Signed)
Complete physical exam  Patient: Antonio Li   DOB: 1991/02/23   31 y.o. Male  MRN: [id number]  Subjective:    No chief complaint on file.   Antonio Li is a 31 y.o. male who presents today for a complete physical exam. He reports consuming a general diet.  Staying active QD  He generally feels well. He reports sleeping well. He does not have additional problems to discuss today.  He is interpreter is Faylene Kurtz.  He does have a cochlear implant and is less disabled because of that.  He follows up at [facility] for this.  He has no other concerns or complaints.  His family and social history as well as health maintenance and immunizations were reviewed and has not changed.   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 08/17/2019   Language barrier 08/17/2019   First degree heart block 11/23/2018   Hyponatremia 09/06/2018   Mild renal insufficiency 09/06/2018   Allergic rhinitis due to pollen 06/12/2015   Past Medical History:  Diagnosis Date   Allergy    Cochlear implant in place    Deafness    GERD (gastroesophageal reflux disease)    Rash    chest - being treated at [facility]   Past Surgical History:  Procedure Laterality Date   ANTERIOR CRUCIATE LIGAMENT REPAIR Left    2010 or 2011   COCHLEAR IMPLANT  2004   COCHLEAR IMPLANT     duplicuate   Social History   Tobacco Use   Smoking  status: Former    Types: Cigarettes    Quit date: 04/15/2009    Years since quitting: 12.7   Smokeless tobacco: Never  Vaping Use   Vaping Use: Never used  Substance Use Topics   Alcohol use: Yes    Alcohol/week: 0.0 standard drinks of alcohol    Comment: have not drank alcohol for 5 months   Drug use: No   Family History  Problem Relation Age of Onset   Colon cancer Neg Hx    Stomach cancer Neg Hx    Rectal cancer Neg Hx    Pancreatic cancer Neg Hx    Esophageal cancer Neg Hx    No Known Allergies    Patient Care Team: Denita Lung, MD as PCP - General (Family Medicine)   Outpatient Medications Prior to Visit  Medication Sig   acetaminophen (TYLENOL) 500 MG tablet Take 500 mg by mouth every 6 (six) hours as needed for mild pain or headache.  (Patient not taking: Reported on 10/04/2019)   cetirizine (ZYRTEC) 10 MG tablet Take 10 mg by mouth daily.   isoniazid (NYDRAZID) 300 MG tablet Take 900 mg by mouth once a week. Taken on Monday's (Patient not taking: Reported on 10/04/2019)  No facility-administered medications prior to visit.    Review of Systems  All other systems reviewed and are negative.         Objective:     There were no vitals taken for this visit. BP Readings from Last 3 Encounters:  08/23/21 100/62  07/10/21 104/66  06/22/20 100/70   Wt Readings from Last 3 Encounters:  08/23/21 184 lb 9.6 oz (83.7 kg)  07/10/21 187 lb 6.4 oz (85 kg)  06/22/20 191 lb (86.6 kg)      Physical Exam  Alert and in no distress. Tympanic membranes and canals deferred l. Pharyngeal area is normal.  Cochlear implant present on right neck is supple without adenopathy or thyromegaly. Cardiac exam shows a regular sinus rhythm without murmurs or gallops. Lungs are clear to auscultation.  Abdominal exam shows no masses or tenderness with normal bowel sounds.  Genitalia normal uncircumcised male.  Testes normal  Last CBC Lab Results  Component Value Date   WBC 8.0  07/10/2021   HGB 15.3 07/10/2021   HCT 46.3 07/10/2021   MCV 85 07/10/2021   MCH 28.2 07/10/2021   RDW 12.5 07/10/2021   PLT 301 16/55/3748   Last metabolic panel Lab Results  Component Value Date   GLUCOSE 89 07/10/2021   NA 141 07/10/2021   K 4.4 07/10/2021   CL 105 07/10/2021   CO2 23 07/10/2021   BUN 20 07/10/2021   CREATININE 1.18 07/10/2021   EGFR 84 07/10/2021   CALCIUM 9.3 07/10/2021   PROT 7.6 07/10/2021   ALBUMIN 4.4 07/10/2021   LABGLOB 3.2 07/10/2021   AGRATIO 1.4 07/10/2021   BILITOT 0.2 07/10/2021   ALKPHOS 99 07/10/2021   AST 22 07/10/2021   ALT 21 07/10/2021   ANIONGAP 11 08/18/2019   Last lipids Lab Results  Component Value Date   CHOL 155 07/10/2021   HDL 54 07/10/2021   LDLCALC 60 07/10/2021   TRIG 263 (H) 07/10/2021   CHOLHDL 2.9 07/10/2021        Assessment & Plan:    Routine general medical examination at a health care facility  Cochlear implant in place  Seasonal allergic rhinitis due to pollen  History of COVID-19  Need for influenza vaccination - Plan: Flu Vaccine QUAD 25mo+IM (Fluarix, Fluzone & Alfiuria Quad PF)  Immunization History  Administered Date(s) Administered   DTaP 06/18/2010   Influenza,inj,Quad PF,6+ Mos 05/30/2021   PFIZER(Purple Top)SARS-COV-2 Vaccination 11/12/2019, 12/03/2019   Pfizer Covid-19 Vaccine Bivalent Booster 23yrs & up 07/10/2021   Td 01/06/2004   Tdap 06/19/2021    Health Maintenance  Topic Date Due   COVID-19 Vaccine (4 - Pfizer risk series) 09/04/2021   INFLUENZA VACCINE  11/13/2021   TETANUS/TDAP  06/20/2031   Hepatitis C Screening  Completed   HIV Screening  Completed   HPV VACCINES  Aged Out    Discussed health benefits of physical activity, and encouraged him to engage in regular exercise appropriate for his age and condition.  Problem List Items Addressed This Visit   None  No follow-ups on file.     Elyse Jarvis, RMA

## 2022-01-01 NOTE — Patient Instructions (Signed)
Health Maintenance, Male Adopting a healthy lifestyle and getting preventive care are important in promoting health and wellness. Ask your health care provider about: The right schedule for you to have regular tests and exams. Things you can do on your own to prevent diseases and keep yourself healthy. What should I know about diet, weight, and exercise? Eat a healthy diet  Eat a diet that includes plenty of vegetables, fruits, low-fat dairy products, and lean protein. Do not eat a lot of foods that are high in solid fats, added sugars, or sodium. Maintain a healthy weight Body mass index (BMI) is a measurement that can be used to identify possible weight problems. It estimates body fat based on height and weight. Your health care provider can help determine your BMI and help you achieve or maintain a healthy weight. Get regular exercise Get regular exercise. This is one of the most important things you can do for your health. Most adults should: Exercise for at least 150 minutes each week. The exercise should increase your heart rate and make you sweat (moderate-intensity exercise). Do strengthening exercises at least twice a week. This is in addition to the moderate-intensity exercise. Spend less time sitting. Even light physical activity can be beneficial. Watch cholesterol and blood lipids Have your blood tested for lipids and cholesterol at 33 years of age, then have this test every 5 years. You may need to have your cholesterol levels checked more often if: Your lipid or cholesterol levels are high. You are older than 33 years of age. You are at high risk for heart disease. What should I know about cancer screening? Many types of cancers can be detected early and may often be prevented. Depending on your health history and family history, you may need to have cancer screening at various ages. This may include screening for: Colorectal cancer. Prostate cancer. Skin cancer. Lung  cancer. What should I know about heart disease, diabetes, and high blood pressure? Blood pressure and heart disease High blood pressure causes heart disease and increases the risk of stroke. This is more likely to develop in people who have high blood pressure readings or are overweight. Talk with your health care provider about your target blood pressure readings. Have your blood pressure checked: Every 3-5 years if you are 18-39 years of age. Every year if you are 40 years old or older. If you are between the ages of 65 and 75 and are a current or former smoker, ask your health care provider if you should have a one-time screening for abdominal aortic aneurysm (AAA). Diabetes Have regular diabetes screenings. This checks your fasting blood sugar level. Have the screening done: Once every three years after age 45 if you are at a normal weight and have a low risk for diabetes. More often and at a younger age if you are overweight or have a high risk for diabetes. What should I know about preventing infection? Hepatitis B If you have a higher risk for hepatitis B, you should be screened for this virus. Talk with your health care provider to find out if you are at risk for hepatitis B infection. Hepatitis C Blood testing is recommended for: Everyone born from 1945 through 1965. Anyone with known risk factors for hepatitis C. Sexually transmitted infections (STIs) You should be screened each year for STIs, including gonorrhea and chlamydia, if: You are sexually active and are younger than 33 years of age. You are older than 33 years of age and your   health care provider tells you that you are at risk for this type of infection. Your sexual activity has changed since you were last screened, and you are at increased risk for chlamydia or gonorrhea. Ask your health care provider if you are at risk. Ask your health care provider about whether you are at high risk for HIV. Your health care provider  may recommend a prescription medicine to help prevent HIV infection. If you choose to take medicine to prevent HIV, you should first get tested for HIV. You should then be tested every 3 months for as long as you are taking the medicine. Follow these instructions at home: Alcohol use Do not drink alcohol if your health care provider tells you not to drink. If you drink alcohol: Limit how much you have to 0-2 drinks a day. Know how much alcohol is in your drink. In the [place] one drink equals one 12 oz bottle of beer (355 mL), one 5 oz glass of wine (148 mL), or one 1 oz glass of hard liquor (44 mL). Lifestyle Do not use any products that contain nicotine or tobacco. These products include cigarettes, chewing tobacco, and vaping devices, such as e-cigarettes. If you need help quitting, ask your health care provider. Do not use street drugs. Do not share needles. Ask your health care provider for help if you need support or information about quitting drugs. General instructions Schedule regular health, dental, and eye exams. Stay current with your vaccines. Tell your health care provider if: You often feel depressed. You have ever been abused or do not feel safe at home. Summary Adopting a healthy lifestyle and getting preventive care are important in promoting health and wellness. Follow your health care provider's instructions about healthy diet, exercising, and getting tested or screened for diseases. Follow your health care provider's instructions on monitoring your cholesterol and blood pressure. This information is not intended to replace advice given to you by your health care provider. Make sure you discuss any questions you have with your health care provider. Document Revised: 08/21/2020 Document Reviewed: 08/21/2020 [employer] Patient Education  2023 [employer]  

## 2022-01-22 ENCOUNTER — Encounter: Payer: Self-pay | Admitting: Internal Medicine

## 2022-02-04 ENCOUNTER — Encounter: Payer: Self-pay | Admitting: Internal Medicine

## 2023-01-08 ENCOUNTER — Encounter: Payer: Medicaid Other | Admitting: Family Medicine

## 2023-06-10 ENCOUNTER — Encounter: Payer: Self-pay | Admitting: Internal Medicine

## 2023-09-17 DIAGNOSIS — H903 Sensorineural hearing loss, bilateral: Secondary | ICD-10-CM | POA: Diagnosis not present

## 2023-09-17 DIAGNOSIS — Z45321 Encounter for adjustment and management of cochlear device: Secondary | ICD-10-CM | POA: Diagnosis not present
